# Patient Record
Sex: Male | Born: 1965 | Hispanic: No | Marital: Married | Smoking: Never smoker
Health system: Southern US, Community
[De-identification: ages and names within clinical notes are randomized; demographics above are authoritative.]

## PROBLEM LIST (undated history)

## (undated) DIAGNOSIS — J338 Other polyp of sinus: Secondary | ICD-10-CM

## (undated) DIAGNOSIS — Z8042 Family history of malignant neoplasm of prostate: Secondary | ICD-10-CM

## (undated) DIAGNOSIS — Z803 Family history of malignant neoplasm of breast: Secondary | ICD-10-CM

## (undated) DIAGNOSIS — C61 Malignant neoplasm of prostate: Secondary | ICD-10-CM

## (undated) DIAGNOSIS — R7989 Other specified abnormal findings of blood chemistry: Secondary | ICD-10-CM

## (undated) DIAGNOSIS — M109 Gout, unspecified: Secondary | ICD-10-CM

## (undated) DIAGNOSIS — I10 Essential (primary) hypertension: Secondary | ICD-10-CM

## (undated) HISTORY — DX: Other specified abnormal findings of blood chemistry: R79.89

## (undated) HISTORY — DX: Gout, unspecified: M10.9

## (undated) HISTORY — DX: Malignant neoplasm of prostate: C61

## (undated) HISTORY — DX: Family history of malignant neoplasm of breast: Z80.3

## (undated) HISTORY — DX: Other polyp of sinus: J33.8

## (undated) HISTORY — DX: Essential (primary) hypertension: I10

## (undated) HISTORY — PX: NASAL ENDOSCOPY: SHX286

## (undated) HISTORY — DX: Family history of malignant neoplasm of prostate: Z80.42

---
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
## 2007-02-08 DIAGNOSIS — I10 Essential (primary) hypertension: Secondary | ICD-10-CM

## 2007-02-08 HISTORY — DX: Essential (primary) hypertension: I10

## 2009-03-17 ENCOUNTER — Ambulatory Visit: Payer: Self-pay | Admitting: Gastroenterology

## 2012-07-09 ENCOUNTER — Ambulatory Visit (INDEPENDENT_AMBULATORY_CARE_PROVIDER_SITE_OTHER): Payer: BC Managed Care – PPO | Admitting: Internal Medicine

## 2012-07-09 ENCOUNTER — Encounter: Payer: Self-pay | Admitting: Internal Medicine

## 2012-07-09 VITALS — BP 132/84 | HR 102 | Temp 98.2°F | Ht 71.0 in | Wt 218.0 lb

## 2012-07-09 DIAGNOSIS — Z23 Encounter for immunization: Secondary | ICD-10-CM

## 2012-07-09 DIAGNOSIS — I10 Essential (primary) hypertension: Secondary | ICD-10-CM | POA: Insufficient documentation

## 2012-07-09 DIAGNOSIS — Z0000 Encounter for general adult medical examination without abnormal findings: Secondary | ICD-10-CM

## 2012-07-09 NOTE — Assessment & Plan Note (Addendum)
Found to have elevated BP 2 weeks ago, but told ~ 2-3 years his BP was elevated; was prescribed a medication recently  which he has been taking, BP today is normal. Discussed risk-consequences of elevated BP including MI stroke CHF CRI Plan: Will find out the name of the medicine. ADDENDUM: HCTZ 25 mg 1 po qd Labs Low-salt diet Followup in 3 months

## 2012-07-09 NOTE — Progress Notes (Signed)
  Subjective:    Patient ID: Justin Elliott, male    DOB: 06-09-1965, 47 y.o.   MRN: [id number]  HPI New patient, here to establish, request a checkup (CPX)  Went to urgent care 2 weeks ago because he felt slightly weak and dizzy, he was found to have elevated BP and  a medication was prescribed. Reports is taking the medication regularly, he is asymptomatic now, no labs or EKGs were done.  Past Medical History  Diagnosis Date  . HTN (hypertension) 2009   Past Surgical History  Procedure Laterality Date  . Finger surgery Right 1984   History   Social History  . Marital Status: Married    Spouse Name: N/A    Number of Children: 2  . Years of Education: N/A   Occupational History  . [employer]    Social History Main Topics  . Smoking status: Never Smoker   . Smokeless tobacco: Never Used  . Alcohol Use: No  . Drug Use: No  . Sexually Active: Not on file   Other Topics Concern  . Not on file   Social History Narrative  . No narrative on file   Family History  Problem Relation Age of Onset  . Diabetes type I Son   . Prostate cancer Father 45  . Colon cancer Neg Hx   . CAD Neg Hx   . Stroke Neg Hx   . Breast cancer Sister     Review of Systems Diet: Regular Exercise: None lately No chest pain or shortness of breath No nausea, vomiting, diarrhea or blood in the stools. No dysuria or gross hematuria.     Objective:   Physical Exam BP 132/84  Pulse 102  Temp(Src) 98.2 F (36.8 C) (Oral)  Ht 5\' 11"  (1.803 m)  Wt 218 lb (98.884 kg)  BMI 30.42 kg/m2  SpO2 95%  General -- alert, well-developed,NAD .   Neck --no thyromegaly , normal carotid pulse Lungs -- normal respiratory effort, no intercostal retractions, no accessory muscle use, and normal breath sounds.   Heart-- normal rate, regular rhythm, no murmur, and no gallop.   Abdomen--soft, non-tender, no distention, no masses, no bruit   Extremities-- no pretibial edema  bilaterally Rectal-- No external abnormalities noted. Normal sphincter tone. No rectal masses or tenderness. Brown stool, Hemoccult negative Prostate:  Prostate gland firm and smooth, no enlargement, nodularity, tenderness, mass, asymmetry or induration. Neurologic-- alert & oriented X3 and strength normal in all extremities. Psych-- Cognition and judgment appear intact. Alert and cooperative with normal attention span and concentration.  not anxious appearing and not depressed appearing.       Assessment & Plan:

## 2012-07-09 NOTE — Patient Instructions (Addendum)
Please call with the name of the medicine you are taking --- Please come back fasting: CMP, CBC, TSH, FLP, PSA--- dx v70 --- Check the  blood pressure 2 or 3 times a week, be sure it is between 110/60 and 140/85. If it is consistently higher or lower, let me know --- Next visit in 3 months      Sodium-Controlled Diet Sodium is a mineral. It is found in many foods. Sodium may be found naturally or added during the making of a food. The most common form of sodium is salt, which is made up of sodium and chloride. Reducing your sodium intake involves changing your eating habits. The following guidelines will help you reduce the sodium in your diet:  Stop using the salt shaker.  Use salt sparingly in cooking and baking.  Substitute with sodium-free seasonings and spices.  Do not use a salt substitute (potassium chloride) without your caregiver's permission.  Include a variety of fresh, unprocessed foods in your diet.  Limit the use of processed and convenience foods that are high in sodium. USE THE FOLLOWING FOODS SPARINGLY: Breads/Starches  Commercial bread stuffing, commercial pancake or waffle mixes, coating mixes. Waffles. Croutons. Prepared (boxed or frozen) potato, rice, or noodle mixes that contain salt or sodium. Salted [place] fries or hash browns. Salted popcorn, breads, crackers, chips, or snack foods. Vegetables  Vegetables canned with salt or prepared in cream, butter, or cheese sauces. Sauerkraut. Tomato or vegetable juices canned with salt.  Fresh vegetables are allowed if rinsed thoroughly. Fruit  Fruit is okay to eat. Meat and Meat Substitutes  Salted or smoked meats, such as bacon or Canadian bacon, chipped or corned beef, hot dogs, salt pork, luncheon meats, pastrami, ham, or sausage. Canned or smoked fish, poultry, or meat. Processed cheese or cheese spreads, blue or Roquefort cheese. Battered or frozen fish products. Prepared spaghetti sauce. Baked beans.  Reuben sandwiches. Salted nuts. Caviar. Milk  Limit buttermilk to 1 cup per week. Soups and Combination Foods  Bouillon cubes, canned or dried soups, broth, consomm. Convenience (frozen or packaged) dinners with more than 600 mg sodium. Pot pies, pizza, Asian food, fast food cheeseburgers, and specialty sandwiches. Desserts and Sweets  Regular (salted) desserts, pie, commercial fruit snack pies, commercial snack cakes, canned puddings.  Eat desserts and sweets in moderation. Fats and Oils  Gravy mixes or canned gravy. No more than 1 to 2 tbs of salad dressing. Chip dips.  Eat fats and oils in moderation. Beverages  See those listed under the vegetables and milk groups. Condiments  Ketchup, mustard, meat sauces, salsa, regular (salted) and lite soy sauce or mustard. Dill pickles, olives, meat tenderizer. Prepared horseradish or pickle relish. Dutch-processed cocoa. Baking powder or baking soda used medicinally. Worcestershire sauce. "Light" salt. Salt substitute, unless approved by your caregiver. Document Released: 07/16/2001 Document Revised: 04/18/2011 Document Reviewed: 02/16/2009 [facility] Patient Information 2014 [place], [place].

## 2012-07-09 NOTE — Assessment & Plan Note (Signed)
Last Tdap years ago, provided one today Never had a cscope Diet-- discussed Exercise-- rec 3 hours week + FH Prostate ca labs including a PSA

## 2012-07-10 ENCOUNTER — Telehealth: Payer: Self-pay | Admitting: Internal Medicine

## 2012-07-10 NOTE — Telephone Encounter (Signed)
Med has been added to med list.

## 2012-07-10 NOTE — Telephone Encounter (Signed)
thx

## 2012-07-10 NOTE — Telephone Encounter (Signed)
Dr. Drue Novel asked pt to bring in missing RX  It is :   HYDROCHLOROTHIAZIDE 25mg   Take one tablet by mouth in the morning    90Qty

## 2012-07-11 ENCOUNTER — Other Ambulatory Visit (INDEPENDENT_AMBULATORY_CARE_PROVIDER_SITE_OTHER): Payer: BC Managed Care – PPO

## 2012-07-11 DIAGNOSIS — Z0000 Encounter for general adult medical examination without abnormal findings: Secondary | ICD-10-CM

## 2012-07-11 LAB — CBC WITH DIFFERENTIAL/PLATELET
Basophils Relative: 0.3 % (ref 0.0–3.0)
Hemoglobin: 15.1 g/dL (ref 13.0–17.0)
Lymphocytes Relative: 35.2 % (ref 12.0–46.0)
Monocytes Relative: 10.7 % (ref 3.0–12.0)
Neutro Abs: 2.4 10*3/uL (ref 1.4–7.7)
RBC: 5.32 Mil/uL (ref 4.22–5.81)
WBC: 4.6 10*3/uL (ref 4.5–10.5)

## 2012-07-11 LAB — PSA: PSA: 0.95 ng/mL (ref 0.10–4.00)

## 2012-07-11 LAB — LIPID PANEL
Cholesterol: 185 mg/dL (ref 0–200)
Total CHOL/HDL Ratio: 7

## 2012-07-11 LAB — COMPREHENSIVE METABOLIC PANEL
AST: 22 U/L (ref 0–37)
BUN: 18 mg/dL (ref 6–23)
CO2: 30 mEq/L (ref 19–32)
Calcium: 10.1 mg/dL (ref 8.4–10.5)
Chloride: 102 mEq/L (ref 96–112)
Creatinine, Ser: 1.4 mg/dL (ref 0.4–1.5)
GFR: 56.21 mL/min — ABNORMAL LOW (ref >60.00)

## 2012-07-11 NOTE — Progress Notes (Signed)
Labs only

## 2012-07-16 ENCOUNTER — Encounter: Payer: Self-pay | Admitting: *Deleted

## 2013-06-26 ENCOUNTER — Telehealth: Payer: Self-pay | Admitting: Internal Medicine

## 2013-06-26 ENCOUNTER — Ambulatory Visit (INDEPENDENT_AMBULATORY_CARE_PROVIDER_SITE_OTHER): Payer: BC Managed Care – PPO | Admitting: Internal Medicine

## 2013-06-26 ENCOUNTER — Encounter: Payer: Self-pay | Admitting: Internal Medicine

## 2013-06-26 VITALS — BP 115/73 | HR 97 | Temp 98.3°F | Wt 224.0 lb

## 2013-06-26 DIAGNOSIS — I10 Essential (primary) hypertension: Secondary | ICD-10-CM

## 2013-06-26 MED ORDER — HYDROCHLOROTHIAZIDE 25 MG PO TABS: 25.0000 mg | ORAL_TABLET | Freq: Every day | ORAL | Status: DC

## 2013-06-26 NOTE — Telephone Encounter (Signed)
Relevant patient education mailed to patient.  

## 2013-06-26 NOTE — Assessment & Plan Note (Signed)
On HCTZ, BP today is very good. He is doing better with diet and exercise Plan: Refill medications, check a BMP, ambulatory BPs, return to the office 4 months for physical

## 2013-06-26 NOTE — Progress Notes (Signed)
   Subjective:    Patient ID: Justin Elliott, male    DOB: 19-Jan-1966, 48 y.o.   MRN: [id number]  DOS:  06/26/2013 Type of  visit: Visit High blood pressure, good compliance with HCTZ. Doing better with diet and exercise. Ambulatory BPs? Patient not sure, thinks are okay.   ROS Denies chest pain, difficulty breathing or lower extremity edema No nausea, vomiting, diarrhea  Past Medical History  Diagnosis Date  . HTN (hypertension) 2009    Past Surgical History  Procedure Laterality Date  . Finger surgery Right 1984    History   Social History  . Marital Status: Married    Spouse Name: N/A    Number of Children: 2  . Years of Education: N/A   Occupational History  . [employer]    Social History Main Topics  . Smoking status: Never Smoker   . Smokeless tobacco: Never Used  . Alcohol Use: No  . Drug Use: No  . Sexual Activity: Not on file   Other Topics Concern  . Not on file   Social History Narrative  . No narrative on file        Medication List       This list is accurate as of: 06/26/13  7:44 PM.  Always use your most recent med list.               hydrochlorothiazide 25 MG tablet  Commonly known as:  HYDRODIURIL  Take 1 tablet (25 mg total) by mouth daily.           Objective:   Physical Exam BP 115/73  Pulse 97  Temp(Src) 98.3 F (36.8 C) (Oral)  Wt 224 lb (101.606 kg)  SpO2 100% General -- alert, well-developed, NAD.   Lungs -- normal respiratory effort, no intercostal retractions, no accessory muscle use, and normal breath sounds.  Heart-- normal rate, regular rhythm, no murmur.  Extremities-- no pretibial edema bilaterally  Neurologic--  alert & oriented X3. Speech normal, gait normal, strength normal in all extremities.  Psych-- Cognition and judgment appear intact. Cooperative with normal attention span and concentration. No anxious or depressed appearing.        Assessment & Plan:

## 2013-06-26 NOTE — Progress Notes (Signed)
Pre-visit discussion using our clinic review tool. No additional management support is needed unless otherwise documented below in the visit note.  

## 2013-06-26 NOTE — Patient Instructions (Signed)
Get your blood work before you leave   Check the  blood pressure 2 or 3 times a  week be sure it is between 110/60 and 140/85. Ideal blood pressure is 120/80. If it is consistently higher or lower, let me know  Next visit is for a physical exam in 4 months  , fasting Please make an appointment

## 2013-06-27 LAB — BASIC METABOLIC PANEL
BUN: 20 mg/dL (ref 6–23)
CALCIUM: 10.2 mg/dL (ref 8.4–10.5)
CHLORIDE: 102 meq/L (ref 96–112)
CO2: 26 meq/L (ref 19–32)
CREATININE: 1.6 mg/dL — AB (ref 0.4–1.5)
GFR: 50.63 mL/min — ABNORMAL LOW (ref >60.00)
GLUCOSE: 82 mg/dL (ref 70–99)
Potassium: 3.7 mEq/L (ref 3.5–5.1)
Sodium: 139 mEq/L (ref 135–145)

## 2013-07-02 ENCOUNTER — Encounter: Payer: Self-pay | Admitting: *Deleted

## 2013-07-12 ENCOUNTER — Telehealth: Payer: Self-pay

## 2013-07-12 NOTE — Telephone Encounter (Signed)
Patient called to review the kidney function test. Advised per recommendations. Patient verbalizes understanding.

## 2013-10-11 ENCOUNTER — Encounter: Payer: BC Managed Care – PPO | Admitting: Internal Medicine

## 2013-10-11 DIAGNOSIS — Z0289 Encounter for other administrative examinations: Secondary | ICD-10-CM

## 2014-03-07 ENCOUNTER — Encounter: Payer: Self-pay | Admitting: Internal Medicine

## 2014-03-07 ENCOUNTER — Ambulatory Visit (INDEPENDENT_AMBULATORY_CARE_PROVIDER_SITE_OTHER): Payer: BLUE CROSS/BLUE SHIELD | Admitting: Internal Medicine

## 2014-03-07 VITALS — BP 118/82 | HR 81 | Temp 98.2°F | Ht 71.0 in | Wt 215.4 lb

## 2014-03-07 DIAGNOSIS — Z23 Encounter for immunization: Secondary | ICD-10-CM

## 2014-03-07 DIAGNOSIS — R972 Elevated prostate specific antigen [PSA]: Secondary | ICD-10-CM

## 2014-03-07 DIAGNOSIS — Z0000 Encounter for general adult medical examination without abnormal findings: Secondary | ICD-10-CM

## 2014-03-07 DIAGNOSIS — I10 Essential (primary) hypertension: Secondary | ICD-10-CM

## 2014-03-07 LAB — COMPREHENSIVE METABOLIC PANEL
ALBUMIN: 4.4 g/dL (ref 3.5–5.2)
ALK PHOS: 55 U/L (ref 39–117)
ALT: 21 U/L (ref 0–53)
AST: 18 U/L (ref 0–37)
BILIRUBIN TOTAL: 0.6 mg/dL (ref 0.2–1.2)
BUN: 15 mg/dL (ref 6–23)
CALCIUM: 10 mg/dL (ref 8.4–10.5)
CO2: 25 mEq/L (ref 19–32)
CREATININE: 1.48 mg/dL — AB (ref 0.50–1.35)
Chloride: 104 mEq/L (ref 96–112)
GLUCOSE: 89 mg/dL (ref 70–99)
POTASSIUM: 4 meq/L (ref 3.5–5.3)
Sodium: 140 mEq/L (ref 135–145)
Total Protein: 7.6 g/dL (ref 6.0–8.3)

## 2014-03-07 LAB — CBC WITH DIFFERENTIAL/PLATELET
BASOS ABS: 0 10*3/uL (ref 0.0–0.1)
BASOS PCT: 0 % (ref 0–1)
Eosinophils Absolute: 0 10*3/uL (ref 0.0–0.7)
Eosinophils Relative: 1 % (ref 0–5)
HCT: 44 % (ref 39.0–52.0)
Hemoglobin: 14.9 g/dL (ref 13.0–17.0)
Lymphocytes Relative: 40 % (ref 12–46)
Lymphs Abs: 1.8 10*3/uL (ref 0.7–4.0)
MCH: 27.3 pg (ref 26.0–34.0)
MCHC: 33.9 g/dL (ref 30.0–36.0)
MCV: 80.6 fL (ref 78.0–100.0)
MPV: 11.3 fL (ref 8.6–12.4)
Monocytes Absolute: 0.2 10*3/uL (ref 0.1–1.0)
Monocytes Relative: 5 % (ref 3–12)
NEUTROS PCT: 54 % (ref 43–77)
Neutro Abs: 2.5 10*3/uL (ref 1.7–7.7)
PLATELETS: 246 10*3/uL (ref 150–400)
RBC: 5.46 MIL/uL (ref 4.22–5.81)
RDW: 13.5 % (ref 11.5–15.5)
WBC: 4.6 10*3/uL (ref 4.0–10.5)

## 2014-03-07 LAB — LIPID PANEL
Cholesterol: 205 mg/dL — ABNORMAL HIGH (ref 0–200)
HDL: 29 mg/dL — AB (ref >39)
LDL CALC: 139 mg/dL — AB (ref 0–99)
TRIGLYCERIDES: 184 mg/dL — AB (ref <150)
Total CHOL/HDL Ratio: 7.1 Ratio
VLDL: 37 mg/dL (ref 0–40)

## 2014-03-07 NOTE — Assessment & Plan Note (Addendum)
Td 2014 Flu shot today Never had a cscope Diet--Exercise-- was doing great last year,  eating healthier and exercising more. Recommend to go back to the dose healthy habits. + FH Prostate ca labs including a PSA

## 2014-03-07 NOTE — Progress Notes (Signed)
   Subjective:    Patient ID: Justin Elliott, male    DOB: February 21, 1965, 49 y.o.   MRN: [id number]  DOS:  03/07/2014 Type of visit - description : cpx Interval history: In general feeling well, good compliance w/ medication. Concerned about his last creatinine  ROS  denies chest pain or difficulty breathing No nausea, vomiting, diarrhea or blood in the stools No recent cough or wheezing No anxiety depression No dysuria, gross hematuria or difficulty urinating  Past Medical History  Diagnosis Date  . HTN (hypertension) 2009    Past Surgical History  Procedure Laterality Date  . Finger surgery Right 1984    History   Social History  . Marital Status: Married    Spouse Name: N/A    Number of Children: 2  . Years of Education: N/A   Occupational History  . [employer]    Social History Main Topics  . Smoking status: Never Smoker   . Smokeless tobacco: Never Used  . Alcohol Use: No  . Drug Use: No  . Sexual Activity: Not on file   Other Topics Concern  . Not on file   Social History Narrative   Household-- wife, 2 children (high school, [employer])     Family History  Problem Relation Age of Onset  . Diabetes type I Son   . Prostate cancer Father 38  . Colon cancer Neg Hx   . CAD Neg Hx   . Stroke Neg Hx   . Breast cancer Sister        Medication List       This list is accurate as of: 03/07/14 11:59 PM.  Always use your most recent med list.               hydrochlorothiazide 25 MG tablet  Commonly known as:  HYDRODIURIL  Take 1 tablet (25 mg total) by mouth daily.           Objective:   Physical Exam BP 118/82 mmHg  Pulse 81  Temp(Src) 98.2 F (36.8 C) (Oral)  Ht 5\' 11"  (1.803 m)  Wt 215 lb 6 oz (97.693 kg)  BMI 30.05 kg/m2  SpO2 96%  General -- alert, well-developed, NAD.  Neck --no thyromegaly   HEENT-- Not pale.   Lungs -- normal respiratory effort, no intercostal retractions, no accessory muscle use, and  normal breath sounds.  Heart-- normal rate, regular rhythm, no murmur.  Abdomen-- Not distended, good bowel sounds,soft, non-tender. No bruit  Rectal-- No external abnormalities noted. Normal sphincter tone. No rectal masses or tenderness. No stool   Prostate--Prostate gland firm and smooth, no enlargement, nodularity, tenderness, mass, asymmetry or induration. Extremities-- no pretibial edema bilaterally  Neurologic--  alert & oriented X3. Speech normal, gait appropriate for age, strength symmetric and appropriate for age.  Psych-- Cognition and judgment appear intact. Cooperative with normal attention span and concentration. No anxious or depressed appearing.         Assessment & Plan:

## 2014-03-07 NOTE — Progress Notes (Signed)
Pre visit review using our clinic review tool, if applicable. No additional management support is needed unless otherwise documented below in the visit note. 

## 2014-03-07 NOTE — Patient Instructions (Signed)
Get your blood work before you leave   Check the  blood pressure monthly  Be sure your blood pressure is between 110/65 and  145/85.  if it is consistently higher or lower, let me know     Please come back to the office in 1 year  for a physical exam. Come back fasting

## 2014-03-07 NOTE — Assessment & Plan Note (Addendum)
BP well-controlled with HCTZ, last creatinine was 1.6, previous creatinine 1.4 about 2 years ago. No older BMPs available. Doubt he has CRI, this could be related  to his large muscle mass. Nevertheless, I recommend to drink plenty of fluids, avoid Motrin or similar medications and will recheck his BMP today. Consider discontinue HCTZ or do further testing such as renal ultrasound

## 2014-03-08 LAB — PSA: PSA: 2.69 ng/mL (ref <=4.00)

## 2014-03-08 LAB — TSH: TSH: 1.486 u[IU]/mL (ref 0.350–4.500)

## 2014-03-10 NOTE — Addendum Note (Signed)
Addended by: Janalee Dane C on: 03/10/2014 05:00 PM   Modules accepted: Orders

## 2014-07-05 ENCOUNTER — Other Ambulatory Visit: Payer: Self-pay | Admitting: Internal Medicine

## 2014-07-08 ENCOUNTER — Other Ambulatory Visit: Payer: Self-pay

## 2014-07-08 MED ORDER — HYDROCHLOROTHIAZIDE 25 MG PO TABS: 25.0000 mg | ORAL_TABLET | Freq: Every day | ORAL | Status: DC

## 2014-07-08 NOTE — Telephone Encounter (Signed)
Rx faxed to [facility].  

## 2014-07-08 NOTE — Telephone Encounter (Signed)
E-prescribing currently down, Rx printed, awaiting MD signature.

## 2014-09-08 ENCOUNTER — Telehealth: Payer: Self-pay | Admitting: Internal Medicine

## 2014-09-08 NOTE — Telephone Encounter (Signed)
Advise patient, needs his PSA recheck ( dx--- increased PSA velocity)

## 2014-09-09 NOTE — Telephone Encounter (Signed)
Letter printed and mailed to Pt informing him he is due for PSA recheck from 02/2014 office visit. Instructed him to call office at his convenience to schedule a lab appt.

## 2014-10-24 ENCOUNTER — Other Ambulatory Visit: Payer: Self-pay | Admitting: Internal Medicine

## 2015-03-09 ENCOUNTER — Telehealth: Payer: Self-pay | Admitting: General Practice

## 2015-03-09 NOTE — Telephone Encounter (Signed)
Called pt to complete pre-visit phone call. LMOVM to return call. Patient advised on message that if unable to return call to please arrive 10-15 minutes early for appointment to update all insurance and paperwork with front desk.

## 2015-03-10 ENCOUNTER — Encounter: Payer: BC Managed Care – PPO | Admitting: Internal Medicine

## 2015-06-15 ENCOUNTER — Telehealth: Payer: Self-pay | Admitting: Behavioral Health

## 2015-06-15 NOTE — Telephone Encounter (Signed)
Unable to reach patient at time of Pre-Visit Call.  Left message for patient to return call when available.    

## 2015-06-16 ENCOUNTER — Encounter: Payer: Self-pay | Admitting: Internal Medicine

## 2015-06-16 ENCOUNTER — Ambulatory Visit (INDEPENDENT_AMBULATORY_CARE_PROVIDER_SITE_OTHER): Payer: BC Managed Care – PPO | Admitting: Internal Medicine

## 2015-06-16 VITALS — BP 116/82 | HR 69 | Temp 98.2°F | Ht 71.0 in | Wt 222.0 lb

## 2015-06-16 DIAGNOSIS — Z1211 Encounter for screening for malignant neoplasm of colon: Secondary | ICD-10-CM

## 2015-06-16 DIAGNOSIS — R7989 Other specified abnormal findings of blood chemistry: Secondary | ICD-10-CM

## 2015-06-16 DIAGNOSIS — Z0000 Encounter for general adult medical examination without abnormal findings: Secondary | ICD-10-CM | POA: Diagnosis not present

## 2015-06-16 DIAGNOSIS — I10 Essential (primary) hypertension: Secondary | ICD-10-CM

## 2015-06-16 NOTE — Patient Instructions (Signed)
GO TO THE LAB : Get the blood work     GO TO THE FRONT DESK Schedule your next appointment for a routine checkup in 6 months, no fasting    Check the  blood pressure   weekly  Be sure your blood pressure is between 110/65 and  135/85. If it is consistently higher or lower, let me know  We'll schedule a ultrasound of your kidneys

## 2015-06-16 NOTE — Progress Notes (Signed)
Pre visit review using our clinic review tool, if applicable. No additional management support is needed unless otherwise documented below in the visit note. 

## 2015-06-16 NOTE — Progress Notes (Signed)
Subjective:    Patient ID: Justin Elliott, male    DOB: 10/28/1965, 50 y.o.   MRN: [id number]  DOS:  06/16/2015 Type of visit - description : CPX Interval history: Since the last time he was here he is feeling well. Unfortunately lost his father few months ago. Mother had a stroke last month, she is recovering well.   Review of Systems  Constitutional: No fever. No chills. No unexplained wt changes. No unusual sweats  HEENT: No dental problems, no ear discharge, no facial swelling, no voice changes. No eye discharge, no eye  redness , no  intolerance to light   Respiratory: No wheezing , no  difficulty breathing. No cough , no mucus production  Cardiovascular: No CP, no leg swelling , no  Palpitations  GI: no nausea, no vomiting, no diarrhea , no  abdominal pain.  No blood in the stools. No dysphagia, no odynophagia    Endocrine: No polyphagia, no polyuria , no polydipsia  GU: No dysuria, gross hematuria, difficulty urinating. No urinary urgency, no frequency.  Musculoskeletal: No joint swellings or unusual aches or pains  Skin: No change in the color of the skin, palor , no  Rash  Allergic, immunologic: No environmental allergies , no  food allergies  Neurological: No dizziness no  syncope. No headaches. No diplopia, no slurred, no slurred speech, no motor deficits, no facial  Numbness  Hematological: No enlarged lymph nodes, no easy bruising , no unusual bleedings  Psychiatry: No suicidal ideas, no hallucinations, no beavior problems, no confusion.     Past Medical History  Diagnosis Date  . HTN (hypertension) 2009    Past Surgical History  Procedure Laterality Date  . Finger surgery Right 1984    Social History   Social History  . Marital Status: Married    Spouse Name: N/A  . Number of Children: 2  . Years of Education: N/A   Occupational History  . [employer]    Social History Main Topics  . Smoking status: Never Smoker   .  Smokeless tobacco: Never Used  . Alcohol Use: No  . Drug Use: No  . Sexual Activity: Not on file   Other Topics Concern  . Not on file   Social History Narrative   Household-- wife, 2 children  D 2003, S 1998     Family History  Problem Relation Age of Onset  . Diabetes type I Son   . Prostate cancer Father 84    died 09-Feb-2015 age 43  . Breast cancer Father   . Colon cancer Neg Hx   . CAD Neg Hx   . Breast cancer Sister   . Stroke Mother       Medication List       This list is accurate as of: 06/16/15 11:59 PM.  Always use your most recent med list.               hydrochlorothiazide 25 MG tablet  Commonly known as:  HYDRODIURIL  Take 1 tablet (25 mg total) by mouth daily.           Objective:   Physical Exam BP 116/82 mmHg  Pulse 69  Temp(Src) 98.2 F (36.8 C) (Oral)  Ht 5\' 11"  (1.803 m)  Wt 222 lb (100.699 kg)  BMI 30.98 kg/m2  SpO2 98%  General:   Well developed, well nourished . NAD.  Neck: No  thyromegaly , normal carotid pulse HEENT:  Normocephalic . Face  symmetric, atraumatic Lungs:  CTA B Normal respiratory effort, no intercostal retractions, no accessory muscle use. Heart: RRR,  no murmur.  No pretibial edema bilaterally  Abdomen:  Not distended, soft, non-tender. No rebound or rigidity.   Rectal:  External abnormalities: none. Normal sphincter tone. No rectal masses or tenderness.  Stool brown  Prostate: Prostate gland firm and smooth, no enlargement, nodularity, tenderness, mass, asymmetry or induration.  Skin: Exposed ,areas without rash. Not pale. Not jaundice Neurologic:  alert & oriented X3.  Speech normal, gait appropriate for age and unassisted Strength symmetric and appropriate for age.  Psych: Cognition and judgment appear intact.  Cooperative with normal attention span and concentration.  Behavior appropriate. no depressed  appearing.    Assessment & Plan:  Assessment: HTN  Plan HTN: On HCTZ, BP today is very good,  at home is rather 130, 140, patient not sure if readings are accurate. Creatinine has been up to 1.6, he has a large muscle mass. To be sure will check a ultrasound and a microalbumin. Recommend ambulatory BPs. RTC 6 months

## 2015-06-16 NOTE — Assessment & Plan Note (Addendum)
Td 2014 + FH prostate and breast cancer, also TIA stroke. CCS:  cscope vs hemocult discussed, elected cscope, will refer   Prostate cancer screening: Last PSA show slight increased velocity, DRE today negative, check another PSA today. Diet--Exercise-- counseled EKG: Normal sinus rhythm Labs: CMP, FLP, CBC, micro, PSA

## 2015-06-17 LAB — COMPREHENSIVE METABOLIC PANEL
ALT: 15 U/L (ref 0–53)
AST: 14 U/L (ref 0–37)
Albumin: 4.4 g/dL (ref 3.5–5.2)
Alkaline Phosphatase: 51 U/L (ref 39–117)
BUN: 16 mg/dL (ref 6–23)
CHLORIDE: 107 meq/L (ref 96–112)
CO2: 28 meq/L (ref 19–32)
Calcium: 10.2 mg/dL (ref 8.4–10.5)
Creatinine, Ser: 1.55 mg/dL — ABNORMAL HIGH (ref 0.40–1.50)
GFR: 50.6 mL/min — ABNORMAL LOW (ref >60.00)
GLUCOSE: 97 mg/dL (ref 70–99)
Potassium: 4.3 mEq/L (ref 3.5–5.1)
SODIUM: 144 meq/L (ref 135–145)
Total Bilirubin: 0.5 mg/dL (ref 0.2–1.2)
Total Protein: 7.3 g/dL (ref 6.0–8.3)

## 2015-06-17 LAB — CBC WITH DIFFERENTIAL/PLATELET
BASOS PCT: 0.5 % (ref 0.0–3.0)
Basophils Absolute: 0 10*3/uL (ref 0.0–0.1)
EOS PCT: 1.3 % (ref 0.0–5.0)
Eosinophils Absolute: 0.1 10*3/uL (ref 0.0–0.7)
HCT: 42 % (ref 39.0–52.0)
Hemoglobin: 13.8 g/dL (ref 13.0–17.0)
Lymphocytes Relative: 39.1 % (ref 12.0–46.0)
Lymphs Abs: 2 10*3/uL (ref 0.7–4.0)
MCHC: 32.8 g/dL (ref 30.0–36.0)
MCV: 84.5 fl (ref 78.0–100.0)
MONO ABS: 0.3 10*3/uL (ref 0.1–1.0)
Monocytes Relative: 6.3 % (ref 3.0–12.0)
Neutro Abs: 2.7 10*3/uL (ref 1.4–7.7)
Neutrophils Relative %: 52.8 % (ref 43.0–77.0)
Platelets: 221 10*3/uL (ref 150.0–400.0)
RBC: 4.96 Mil/uL (ref 4.22–5.81)
RDW: 13 % (ref 11.5–15.5)
WBC: 5.2 10*3/uL (ref 4.0–10.5)

## 2015-06-17 LAB — MICROALBUMIN / CREATININE URINE RATIO
Creatinine,U: 166.4 mg/dL
Microalb Creat Ratio: 0.4 mg/g (ref 0.0–30.0)

## 2015-06-17 LAB — LIPID PANEL
CHOL/HDL RATIO: 7
CHOLESTEROL: 196 mg/dL (ref 0–200)
HDL: 30.1 mg/dL — ABNORMAL LOW (ref >39.00)
LDL CALC: 127 mg/dL — AB (ref 0–99)
NonHDL: 165.9
Triglycerides: 197 mg/dL — ABNORMAL HIGH (ref 0.0–149.0)
VLDL: 39.4 mg/dL (ref 0.0–40.0)

## 2015-06-17 LAB — PSA: PSA: 1.74 ng/mL (ref 0.10–4.00)

## 2015-06-18 ENCOUNTER — Encounter: Payer: Self-pay | Admitting: Internal Medicine

## 2015-06-28 DIAGNOSIS — Z09 Encounter for follow-up examination after completed treatment for conditions other than malignant neoplasm: Secondary | ICD-10-CM | POA: Insufficient documentation

## 2015-06-28 NOTE — Assessment & Plan Note (Signed)
HTN: On HCTZ, BP today is very good, at home is rather 130, 140, patient not sure if readings are accurate. Creatinine has been up to 1.6, he has a large muscle mass. To be sure will check a ultrasound and a microalbumin. Recommend ambulatory BPs. RTC 6 months

## 2015-07-13 ENCOUNTER — Other Ambulatory Visit: Payer: Self-pay | Admitting: Internal Medicine

## 2015-07-30 ENCOUNTER — Ambulatory Visit (AMBULATORY_SURGERY_CENTER): Payer: Self-pay

## 2015-07-30 VITALS — Ht 66.0 in | Wt 215.0 lb

## 2015-07-30 DIAGNOSIS — Z1211 Encounter for screening for malignant neoplasm of colon: Secondary | ICD-10-CM

## 2015-07-30 MED ORDER — NA SULFATE-K SULFATE-MG SULF 17.5-3.13-1.6 GM/177ML PO SOLN: 1.0000 | Freq: Once | ORAL | Status: DC

## 2015-07-30 NOTE — Progress Notes (Signed)
No egg or soy allergy.  No previous complications from anesthesia. No home O2. No diet meds. 

## 2015-08-03 ENCOUNTER — Encounter: Payer: Self-pay | Admitting: Internal Medicine

## 2015-08-07 ENCOUNTER — Ambulatory Visit (HOSPITAL_BASED_OUTPATIENT_CLINIC_OR_DEPARTMENT_OTHER)
Admission: RE | Admit: 2015-08-07 | Discharge: 2015-08-07 | Disposition: A | Discharge status: Home / Self Care | Payer: BC Managed Care – PPO | Source: Ambulatory Visit | Attending: Internal Medicine | Admitting: Internal Medicine

## 2015-08-07 DIAGNOSIS — R748 Abnormal levels of other serum enzymes: Secondary | ICD-10-CM | POA: Diagnosis present

## 2015-08-07 DIAGNOSIS — I10 Essential (primary) hypertension: Secondary | ICD-10-CM | POA: Insufficient documentation

## 2015-08-13 ENCOUNTER — Ambulatory Visit (AMBULATORY_SURGERY_CENTER): Payer: BC Managed Care – PPO | Admitting: Internal Medicine

## 2015-08-13 ENCOUNTER — Encounter: Payer: Self-pay | Admitting: Internal Medicine

## 2015-08-13 VITALS — BP 134/87 | HR 64 | Temp 97.5°F | Resp 21 | Ht 66.0 in | Wt 215.0 lb

## 2015-08-13 DIAGNOSIS — K635 Polyp of colon: Secondary | ICD-10-CM | POA: Diagnosis not present

## 2015-08-13 DIAGNOSIS — D124 Benign neoplasm of descending colon: Secondary | ICD-10-CM

## 2015-08-13 DIAGNOSIS — Z1211 Encounter for screening for malignant neoplasm of colon: Secondary | ICD-10-CM | POA: Diagnosis present

## 2015-08-13 DIAGNOSIS — D125 Benign neoplasm of sigmoid colon: Secondary | ICD-10-CM

## 2015-08-13 MED ORDER — SODIUM CHLORIDE 0.9 % IV SOLN: 500.0000 mL | INTRAVENOUS | Status: DC

## 2015-08-13 NOTE — Patient Instructions (Signed)
YOU HAD AN ENDOSCOPIC PROCEDURE TODAY AT THE [facility]:   Refer to the procedure report that was given to you for any specific questions about what was found during the examination.  If the procedure report does not answer your questions, please call your gastroenterologist to clarify.  If you requested that your care partner not be given the details of your procedure findings, then the procedure report has been included in a sealed envelope for you to review at your convenience later.  YOU SHOULD EXPECT: Some feelings of bloating in the abdomen. Passage of more gas than usual.  Walking can help get rid of the air that was put into your GI tract during the procedure and reduce the bloating. If you had a lower endoscopy (such as a colonoscopy or flexible sigmoidoscopy) you may notice spotting of blood in your stool or on the toilet paper. If you underwent a bowel prep for your procedure, you may not have a normal bowel movement for a few days.  Please Note:  You might notice some irritation and congestion in your nose or some drainage.  This is from the oxygen used during your procedure.  There is no need for concern and it should clear up in a day or so.  SYMPTOMS TO REPORT IMMEDIATELY:   Following lower endoscopy (colonoscopy or flexible sigmoidoscopy):  Excessive amounts of blood in the stool  Significant tenderness or worsening of abdominal pains  Swelling of the abdomen that is new, acute  Fever of 100F or higher   For urgent or emergent issues, a gastroenterologist can be reached at any hour by calling [phone].   DIET: Your first meal following the procedure should be a small meal and then it is ok to progress to your normal diet. Heavy or fried foods are harder to digest and may make you feel nauseous or bloated.  Likewise, meals heavy in dairy and vegetables can increase bloating.  Drink plenty of fluids but you should avoid alcoholic beverages for 24  hours.  ACTIVITY:  You should plan to take it easy for the rest of today and you should NOT DRIVE or use heavy machinery until tomorrow (because of the sedation medicines used during the test).    FOLLOW UP: Our staff will call the number listed on your records the next business day following your procedure to check on you and address any questions or concerns that you may have regarding the information given to you following your procedure. If we do not reach you, we will leave a message.  However, if you are feeling well and you are not experiencing any problems, there is no need to return our call.  We will assume that you have returned to your regular daily activities without incident.  If any biopsies were taken you will be contacted by phone or by letter within the next 1-3 weeks.  Please call us at [phone] if you have not heard about the biopsies in 3 weeks.    SIGNATURES/CONFIDENTIALITY: You and/or your care partner have signed paperwork which will be entered into your electronic medical record.  These signatures attest to the fact that that the information above on your After Visit Summary has been reviewed and is understood.  Full responsibility of the confidentiality of this discharge information lies with you and/or your care-partner.  Polyp information given.  

## 2015-08-13 NOTE — Progress Notes (Signed)
To recovery, report to Scott, RN, VSS 

## 2015-08-13 NOTE — Op Note (Signed)
[place] Patient Name: Justin Elliott Procedure Date: 08/13/2015 10:20 AM MRN: [id number] Endoscopist: Jerene Bears , MD Age: 50 Referring MD:  Date of Birth: 1965-11-18 Gender: Male Account #: [id number] Procedure:                Colonoscopy Indications:              Screening for colorectal malignant neoplasm, This                            is the patient's first colonoscopy Medicines:                Monitored Anesthesia Care Procedure:                Pre-Anesthesia Assessment:                           - Prior to the procedure, a History and Physical                            was performed, and patient medications and                            allergies were reviewed. The patient's tolerance of                            previous anesthesia was also reviewed. The risks                            and benefits of the procedure and the sedation                            options and risks were discussed with the patient.                            All questions were answered, and informed consent                            was obtained. Prior Anticoagulants: The patient has                            taken no previous anticoagulant or antiplatelet                            agents. ASA Grade Assessment: II - A patient with                            mild systemic disease. After reviewing the risks                            and benefits, the patient was deemed in                            satisfactory condition to undergo the procedure.  After obtaining informed consent, the colonoscope                            was passed under direct vision. Throughout the                            procedure, the patient's blood pressure, pulse, and                            oxygen saturations were monitored continuously. The                            Model CF-HQ190L [id number]) scope was introduced                            through the anus and  advanced to the the cecum,                            identified by appendiceal orifice and ileocecal                            valve. The colonoscopy was performed without                            difficulty. The patient tolerated the procedure                            well. The quality of the bowel preparation was                            excellent. The ileocecal valve, appendiceal                            orifice, and rectum were photographed. Scope In: 10:29:00 AM Scope Out: 10:40:37 AM Scope Withdrawal Time: 0 hours 9 minutes 45 seconds  Total Procedure Duration: 0 hours 11 minutes 37 seconds  Findings:                 The digital rectal exam was normal.                           Two sessile polyps were found in the sigmoid colon                            and descending colon. The polyps were 4 to 5 mm in                            size. These polyps were removed with a cold snare.                            Resection and retrieval were complete.                           The exam was otherwise without abnormality on  direct and retroflexion views. Complications:            No immediate complications. Estimated Blood Loss:     Estimated blood loss was minimal. Impression:               - Two 4 to 5 mm polyps in the sigmoid colon and in                            the descending colon, removed with a cold snare.                            Resected and retrieved.                           - The examination was otherwise normal on direct                            and retroflexion views. Recommendation:           - Patient has a contact number available for                            emergencies. The signs and symptoms of potential                            delayed complications were discussed with the                            patient. Return to normal activities tomorrow.                            Written discharge instructions were provided to  the                            patient.                           - Resume previous diet.                           - Continue present medications.                           - Await pathology results.                           - Repeat colonoscopy is recommended. The                            colonoscopy date will be determined after pathology                            results from today's exam become available for                            review. Jerene Bears, MD 08/13/2015 10:43:08 AM This report has been signed  electronically.

## 2015-08-13 NOTE — Progress Notes (Signed)
Called to room to assist during endoscopic procedure.  Patient ID and intended procedure confirmed with present staff. Received instructions for my participation in the procedure from the performing physician.  

## 2015-08-14 ENCOUNTER — Telehealth: Payer: Self-pay | Admitting: *Deleted

## 2015-08-14 NOTE — Telephone Encounter (Signed)
Number identifier, left message, follow-up  

## 2015-08-24 ENCOUNTER — Encounter: Payer: Self-pay | Admitting: Internal Medicine

## 2015-12-01 ENCOUNTER — Telehealth: Payer: Self-pay | Admitting: Internal Medicine

## 2015-12-01 NOTE — Telephone Encounter (Signed)
Pt dropped off health examination certificate for dr. Larose Kells to fill out, pt states he will pick up and if possible on 12/04/15, documents left in tray at front office

## 2015-12-02 NOTE — Telephone Encounter (Signed)
lvm for pt, informing him of the  Message below, asked pt to please return call.

## 2015-12-02 NOTE — Telephone Encounter (Signed)
Received physical form, and reviewed, Pt had CPE in 06/2015, however he will need Hep B titers or vaccinations, and a TB test. Please call Pt and schedule appt at his convenience. PCP will be off remainder of week due to medical board exams. Earliest appt next week unless he agrees to see another provider. Thank you.

## 2015-12-04 NOTE — Telephone Encounter (Signed)
Patient scheduled with PCP Friday 12/11/15.

## 2015-12-11 ENCOUNTER — Ambulatory Visit (INDEPENDENT_AMBULATORY_CARE_PROVIDER_SITE_OTHER): Payer: BC Managed Care – PPO

## 2015-12-11 ENCOUNTER — Ambulatory Visit (INDEPENDENT_AMBULATORY_CARE_PROVIDER_SITE_OTHER): Payer: BC Managed Care – PPO | Admitting: Internal Medicine

## 2015-12-11 DIAGNOSIS — I10 Essential (primary) hypertension: Secondary | ICD-10-CM

## 2015-12-11 DIAGNOSIS — Z111 Encounter for screening for respiratory tuberculosis: Secondary | ICD-10-CM | POA: Diagnosis not present

## 2015-12-11 NOTE — Progress Notes (Deleted)
Pre visit review using our clinic review tool, if applicable. No additional management support is needed unless otherwise documented below in the visit note. 

## 2015-12-11 NOTE — Progress Notes (Signed)
Pre visit review using our clinic tool,if applicable. No additional management support is needed unless otherwise documented below in the visit note.   Patient in for PPD. Will return on Monday for recheck.

## 2015-12-14 LAB — TB SKIN TEST
Induration: 0 mm
TB Skin Test: NEGATIVE

## 2015-12-14 NOTE — Progress Notes (Signed)
appointment cancelled

## 2015-12-21 ENCOUNTER — Other Ambulatory Visit: Payer: Self-pay

## 2015-12-25 ENCOUNTER — Encounter: Payer: Self-pay | Admitting: Internal Medicine

## 2015-12-25 ENCOUNTER — Ambulatory Visit (INDEPENDENT_AMBULATORY_CARE_PROVIDER_SITE_OTHER): Payer: BC Managed Care – PPO | Admitting: Internal Medicine

## 2015-12-25 VITALS — BP 122/78 | HR 79 | Temp 97.9°F | Resp 12 | Ht 66.0 in | Wt 229.0 lb

## 2015-12-25 DIAGNOSIS — I10 Essential (primary) hypertension: Secondary | ICD-10-CM

## 2015-12-25 DIAGNOSIS — Z23 Encounter for immunization: Secondary | ICD-10-CM | POA: Diagnosis not present

## 2015-12-25 NOTE — Patient Instructions (Signed)
   GO TO THE FRONT DESK Schedule your next appointment for a  PHYSICAL  In 6 to 8 months

## 2015-12-25 NOTE — Assessment & Plan Note (Signed)
HTN:  Since the last office visit, renal US and  microalbumin negative. Ambulatory BPs less than 130s/80s, good medication compliance. Creatinine remains stable, likely slightly higher than "normal" due to large muscle mass. No change. Flu shot today RTC 6-8 months, CPX

## 2015-12-25 NOTE — Progress Notes (Signed)
   Subjective:    Patient ID: Justin Elliott, male    DOB: 24-Nov-1965, 50 y.o.   MRN: [id number]  DOS:  12/25/2015 Type of visit - description : bp f/u Interval history:   Since last office visit he is doing great. Ambulatory BPs [id number], [id number] w/ diastolic of [id number]. Labs  and ultrasound results reviewed.  Review of Systems He remains active, has a healthy lifestyle, no edema  Past Medical History:  Diagnosis Date  . HTN (hypertension) 2009    Past Surgical History:  Procedure Laterality Date  . FINGER SURGERY Right 1984    Social History   Social History  . Marital status: Married    Spouse name: N/A  . Number of children: 2  . Years of education: N/A   Occupational History  . [employer]    Social History Main Topics  . Smoking status: Never Smoker  . Smokeless tobacco: Never Used  . Alcohol use No  . Drug use: No  . Sexual activity: Not on file   Other Topics Concern  . Not on file   Social History Narrative   Household-- wife, 2 children  D 2003, S 1998        Medication List       Accurate as of 12/25/15  8:30 AM. Always use your most recent med list.          hydrochlorothiazide 25 MG tablet Commonly known as:  HYDRODIURIL Take 1 tablet (25 mg total) by mouth daily.          Objective:   Physical Exam BP 122/78 (BP Location: Left Arm, Patient Position: Sitting, Cuff Size: Normal)   Pulse 79   Temp 97.9 F (36.6 C) (Oral)   Resp 12   Ht 5\' 6"  (1.676 m)   Wt 229 lb (103.9 kg)   SpO2 95%   BMI 36.96 kg/m  General:   Well developed, well nourished . NAD.  HEENT:  Normocephalic . Face symmetric, atraumatic Lungs:  CTA B Normal respiratory effort, no intercostal retractions, no accessory muscle use. Heart: RRR,  no murmur.  No pretibial edema bilaterally  Skin: Not pale. Not jaundice Neurologic:  alert & oriented X3.  Speech normal, gait appropriate for age and unassisted Psych--  Cognition and judgment appear  intact.  Cooperative with normal attention span and concentration.  Behavior appropriate. No anxious or depressed appearing.      Assessment & Plan:   Assessment: HTN  Plan HTN:  Since the last office visit, renal US and  microalbumin negative. Ambulatory BPs less than 130s/80s, good medication compliance. Creatinine remains stable, likely slightly higher than "normal" due to large muscle mass. No change. Flu shot today RTC 6-8 months, CPX

## 2015-12-25 NOTE — Progress Notes (Signed)
Pre visit review using our clinic review tool, if applicable. No additional management support is needed unless otherwise documented below in the visit note. 

## 2016-07-06 ENCOUNTER — Ambulatory Visit (INDEPENDENT_AMBULATORY_CARE_PROVIDER_SITE_OTHER): Payer: BC Managed Care – PPO | Admitting: Internal Medicine

## 2016-07-06 ENCOUNTER — Encounter: Payer: Self-pay | Admitting: Internal Medicine

## 2016-07-06 VITALS — BP 124/68 | HR 80 | Temp 97.8°F | Resp 14 | Ht 66.0 in | Wt 229.4 lb

## 2016-07-06 DIAGNOSIS — Z0000 Encounter for general adult medical examination without abnormal findings: Secondary | ICD-10-CM

## 2016-07-06 LAB — BASIC METABOLIC PANEL
BUN: 15 mg/dL (ref 6–23)
CALCIUM: 9.8 mg/dL (ref 8.4–10.5)
CHLORIDE: 106 meq/L (ref 96–112)
CO2: 26 mEq/L (ref 19–32)
CREATININE: 1.45 mg/dL (ref 0.40–1.50)
GFR: 54.42 mL/min — AB (ref >60.00)
Glucose, Bld: 103 mg/dL — ABNORMAL HIGH (ref 70–99)
Potassium: 3.9 mEq/L (ref 3.5–5.1)
Sodium: 139 mEq/L (ref 135–145)

## 2016-07-06 LAB — CBC WITH DIFFERENTIAL/PLATELET
BASOS PCT: 0.6 % (ref 0.0–3.0)
Basophils Absolute: 0 10*3/uL (ref 0.0–0.1)
EOS PCT: 1.5 % (ref 0.0–5.0)
Eosinophils Absolute: 0.1 10*3/uL (ref 0.0–0.7)
HEMATOCRIT: 42.5 % (ref 39.0–52.0)
Hemoglobin: 14.4 g/dL (ref 13.0–17.0)
LYMPHS ABS: 1.6 10*3/uL (ref 0.7–4.0)
Lymphocytes Relative: 47.1 % — ABNORMAL HIGH (ref 12.0–46.0)
MCHC: 33.8 g/dL (ref 30.0–36.0)
MCV: 83.5 fl (ref 78.0–100.0)
MONOS PCT: 6.4 % (ref 3.0–12.0)
Monocytes Absolute: 0.2 10*3/uL (ref 0.1–1.0)
NEUTROS ABS: 1.5 10*3/uL (ref 1.4–7.7)
NEUTROS PCT: 44.4 % (ref 43.0–77.0)
PLATELETS: 212 10*3/uL (ref 150.0–400.0)
RBC: 5.08 Mil/uL (ref 4.22–5.81)
RDW: 13 % (ref 11.5–15.5)
WBC: 3.4 10*3/uL — ABNORMAL LOW (ref 4.0–10.5)

## 2016-07-06 LAB — LIPID PANEL
CHOL/HDL RATIO: 7
CHOLESTEROL: 191 mg/dL (ref 0–200)
HDL: 26.9 mg/dL — ABNORMAL LOW (ref >39.00)
NonHDL: 164.04
TRIGLYCERIDES: 216 mg/dL — AB (ref 0.0–149.0)
VLDL: 43.2 mg/dL — ABNORMAL HIGH (ref 0.0–40.0)

## 2016-07-06 LAB — TSH: TSH: 1.8 u[IU]/mL (ref 0.35–4.50)

## 2016-07-06 LAB — LDL CHOLESTEROL, DIRECT: Direct LDL: 117 mg/dL

## 2016-07-06 MED ORDER — HYDROCHLOROTHIAZIDE 25 MG PO TABS: 25.0000 mg | ORAL_TABLET | Freq: Every day | ORAL | 3 refills | Status: DC

## 2016-07-06 NOTE — Progress Notes (Signed)
Pre visit review using our clinic review tool, if applicable. No additional management support is needed unless otherwise documented below in the visit note. 

## 2016-07-06 NOTE — Patient Instructions (Signed)
GO TO THE LAB : Get the blood work     GO TO THE FRONT DESK Schedule your next appointment for a  physical exam in one year     Check the  blood pressure   monthly   Be sure your blood pressure is between 110/65 and  145/85. If it is consistently higher or lower, let me know

## 2016-07-06 NOTE — Progress Notes (Signed)
   Subjective:    Patient ID: Justin Elliott, male    DOB: [birth year], 51 y.o.   MRN: [id number]  DOS:  07/06/2016 Type of visit - description : cpx Interval history: Feeling well, no major concerns, good medication compliance.   Review of Systems Occasionally allergies due to grass, usually has a rash on exposed areas , +t itching. After the rash is gone he has some hyperpigmentation.   Other than above, a 14 point review of systems is negative     Past Medical History:  Diagnosis Date  . HTN (hypertension) 2009    Past Surgical History:  Procedure Laterality Date  . FINGER SURGERY Right 1984    Social History   Social History  . Marital status: Married    Spouse name: N/A  . Number of children: 2  . Years of education: N/A   Occupational History  . [employer]    Social History Main Topics  . Smoking status: Never Smoker  . Smokeless tobacco: Never Used  . Alcohol use No  . Drug use: No  . Sexual activity: Not on file   Other Topics Concern  . Not on file   Social History Narrative   Household-- wife, 2 children  D 2003, S 1998   PHD 2017     Family History  Problem Relation Age of Onset  . Prostate cancer Father 34       died 04/06/2015 age 62  . Breast cancer Father   . Breast cancer Sister   . Diabetes type I Son   . Stroke Mother   . Colon cancer Neg Hx   . CAD Neg Hx      Allergies as of 07/06/2016   No Known Allergies     Medication List       Accurate as of 07/06/16 11:59 PM. Always use your most recent med list.          hydrochlorothiazide 25 MG tablet Commonly known as:  HYDRODIURIL Take 1 tablet (25 mg total) by mouth daily.          Objective:   Physical Exam BP 124/68 (BP Location: Left Arm, Patient Position: Sitting, Cuff Size: Normal)   Pulse 80   Temp 97.8 F (36.6 C) (Oral)   Resp 14   Ht 5\' 6"  (1.676 m)   Wt 229 lb 6 oz (104 kg)   SpO2 98%   BMI 37.02 kg/m   General:   Well developed,  well nourished . NAD.  Neck: No  thyromegaly  HEENT:  Normocephalic . Face symmetric, atraumatic Lungs:  CTA B Normal respiratory effort, no intercostal retractions, no accessory muscle use. Heart: RRR,  no murmur.  No pretibial edema bilaterally  Abdomen:  Not distended, soft, non-tender. No rebound or rigidity.   Skin: Exposed areas without rash. Not pale. Not jaundice Neurologic:  alert & oriented X3.  Speech normal, gait appropriate for age and unassisted Strength symmetric and appropriate for age.  Psych: Cognition and judgment appear intact.  Cooperative with normal attention span and concentration.  Behavior appropriate. No anxious or depressed appearing.    Assessment & Plan:    Assessment: HTN Slightly increased creatinine: Renal US2017 wnl, microalbumin (-). Likely d/t large muscle mass  PLAN HTN:  Seems well-controlled on HCTZ, RFs, labs. RTC one year

## 2016-07-06 NOTE — Assessment & Plan Note (Addendum)
--  Td 2014 -- + FH prostate and breast cancer, also TIA stroke. --CCS: cscope 08-2015, 10 years --Prostate cancer screening: DRE 2017 (-) . PSA had a slt increased velocity but last  PSA 2017 wnl, reassess next year --Diet, Exercise: Discussed --Labs: BMP, FLP, CBC, TSH

## 2016-07-07 NOTE — Assessment & Plan Note (Signed)
HTN:  Seems well-controlled on HCTZ, RFs, labs. RTC one year

## 2016-08-09 ENCOUNTER — Ambulatory Visit (INDEPENDENT_AMBULATORY_CARE_PROVIDER_SITE_OTHER): Payer: BC Managed Care – PPO | Admitting: Internal Medicine

## 2016-08-09 ENCOUNTER — Encounter: Payer: Self-pay | Admitting: Internal Medicine

## 2016-08-09 VITALS — BP 118/72 | HR 83 | Temp 98.1°F | Resp 14 | Ht 66.0 in | Wt 224.1 lb

## 2016-08-09 DIAGNOSIS — M109 Gout, unspecified: Secondary | ICD-10-CM | POA: Diagnosis not present

## 2016-08-09 DIAGNOSIS — I10 Essential (primary) hypertension: Secondary | ICD-10-CM

## 2016-08-09 LAB — BASIC METABOLIC PANEL
BUN: 18 mg/dL (ref 6–23)
CALCIUM: 10.2 mg/dL (ref 8.4–10.5)
CO2: 26 meq/L (ref 19–32)
Chloride: 103 mEq/L (ref 96–112)
Creatinine, Ser: 1.61 mg/dL — ABNORMAL HIGH (ref 0.40–1.50)
GFR: 48.21 mL/min — ABNORMAL LOW (ref >60.00)
GLUCOSE: 100 mg/dL — AB (ref 70–99)
Potassium: 3.7 mEq/L (ref 3.5–5.1)
SODIUM: 138 meq/L (ref 135–145)

## 2016-08-09 LAB — URIC ACID: Uric Acid, Serum: 10.1 mg/dL — ABNORMAL HIGH (ref 4.0–7.8)

## 2016-08-09 MED ORDER — PREDNISONE 10 MG PO TABS: ORAL_TABLET | ORAL | 0 refills | Status: DC

## 2016-08-09 MED ORDER — CARVEDILOL 12.5 MG PO TABS: 12.5000 mg | ORAL_TABLET | Freq: Two times a day (BID) | ORAL | 3 refills | Status: DC

## 2016-08-09 MED ORDER — COLCHICINE 0.6 MG PO TABS: 0.6000 mg | ORAL_TABLET | Freq: Two times a day (BID) | ORAL | 1 refills | Status: DC | PRN

## 2016-08-09 NOTE — Progress Notes (Signed)
Pre visit review using our clinic review tool, if applicable. No additional management support is needed unless otherwise documented below in the visit note. 

## 2016-08-09 NOTE — Patient Instructions (Signed)
GO TO THE LAB : Get the blood work     GO TO THE FRONT DESK Schedule your next appointment for a  checkup in 6 weeks  Stop hydrochlorothiazide Start carvedilol to control your blood pressure  For gout: Feet elevation ICE Prednisone for a few days Colchicine twice a day until better  Call if not gradually better   Gout Gout is painful swelling that can happen in some of your joints. Gout is a type of arthritis. This condition is caused by having too much uric acid in your body. Uric acid is a chemical that is made when your body breaks down substances called purines. If your body has too much uric acid, sharp crystals can form and build up in your joints. This causes pain and swelling. Gout attacks can happen quickly and be very painful (acute gout). Over time, the attacks can affect more joints and happen more often (chronic gout). Follow these instructions at home: During a Gout Attack  If directed, put ice on the painful area: ? Put ice in a plastic bag. ? Place a towel between your skin and the bag. ? Leave the ice on for 20 minutes, 2-3 times a day.  Rest the joint as much as possible. If the joint is in your leg, you may be given crutches to use.  Raise (elevate) the painful joint above the level of your heart as often as you can.  Drink enough fluids to keep your pee (urine) clear or pale yellow.  Take over-the-counter and prescription medicines only as told by your doctor.  Do not drive or use heavy machinery while taking prescription pain medicine.  Follow instructions from your doctor about what you can or cannot eat and drink.  Return to your normal activities as told by your doctor. Ask your doctor what activities are safe for you. Avoiding Future Gout Attacks  Follow a low-purine diet as told by a specialist (dietitian) or your doctor. Avoid foods and drinks that have a lot of purines, such  as: ? Liver. ? Kidney. ? Anchovies. ? Asparagus. ? Herring. ? Mushrooms ? Mussels. ? Beer.  Limit alcohol intake to no more than 1 drink a day for nonpregnant women and 2 drinks a day for men. One drink equals 12 oz of beer, 5 oz of wine, or 1 oz of hard liquor.  Stay at a healthy weight or lose weight if you are overweight. If you want to lose weight, talk with your doctor. It is important that you do not lose weight too fast.  Start or continue an exercise plan as told by your doctor.  Drink enough fluids to keep your pee clear or pale yellow.  Take over-the-counter and prescription medicines only as told by your doctor.  Keep all follow-up visits as told by your doctor. This is important. Contact a doctor if:  You have another gout attack.  You still have symptoms of a gout attack after10 days of treatment.  You have problems (side effects) because of your medicines.  You have chills or a fever.  You have burning pain when you pee (urinate).  You have pain in your lower back or belly. Get help right away if:  You have very bad pain.  Your pain cannot be controlled.  You cannot pee. This information is not intended to replace advice given to you by your health care provider. Make sure you discuss any questions you have with your health care provider. Document Released: 11/03/2007  Document Revised: 07/02/2015 Document Reviewed: 11/06/2014 Elsevier Interactive Patient Education  [employer].

## 2016-08-09 NOTE — Progress Notes (Signed)
Subjective:    Patient ID: Justin Elliott, male    DOB: 01-05-1966, 51 y.o.   MRN: [id number]  DOS:  08/09/2016 Type of visit - description : acute Interval history: About 2 weeks ago developed left foot pain and swelling, worse at the base of the great toe. As the L foot was  improving, he started develop similar symptoms on the right foot. The area was swollen, hot and tender to touch. No history of previous gout, has not taken any medications.  Review of Systems  Denies fever, chills. No injury or overuse. Denies any calf pain. Not taking any new medications. On HCTZ chronically   Past Medical History:  Diagnosis Date  . HTN (hypertension) 2009    Past Surgical History:  Procedure Laterality Date  . FINGER SURGERY Right 1984    Social History   Social History  . Marital status: Married    Spouse name: N/A  . Number of children: 2  . Years of education: N/A   Occupational History  . [employer]    Social History Main Topics  . Smoking status: Never Smoker  . Smokeless tobacco: Never Used  . Alcohol use No  . Drug use: No  . Sexual activity: Not on file   Other Topics Concern  . Not on file   Social History Narrative   Household-- wife, 2 children  D 2003, S 1998   PHD 2017      Allergies as of 08/09/2016   No Known Allergies     Medication List       Accurate as of 08/09/16 11:59 PM. Always use your most recent med list.          carvedilol 12.5 MG tablet Commonly known as:  COREG Take 1 tablet (12.5 mg total) by mouth 2 (two) times daily with a meal.   colchicine 0.6 MG tablet Take 1 tablet (0.6 mg total) by mouth 2 (two) times daily as needed. OK TO USE BRAND MITIGARE   predniSONE 10 MG tablet Commonly known as:  DELTASONE 4 tablets x 2 days, 3 tabs x 2 days, 2 tabs x 2 days, 1 tab x 2 days          Objective:   Physical Exam BP 118/72 (BP Location: Left Arm, Patient Position: Sitting, Cuff Size: Normal)    Pulse 83   Temp 98.1 F (36.7 C) (Oral)   Resp 14   Ht 5\' 6"  (1.676 m)   Wt 224 lb 2 oz (101.7 kg)   SpO2 98%   BMI 36.17 kg/m  General:   Well developed, well nourished . NAD.  HEENT:  Normocephalic . Face symmetric, atraumatic Vascular: Good pedal pulses bilaterally, MSK: Calves symmetric Right foot: Mildly swollen, warm distally, the base of the great toe is exquisitely TTP, more swollen and red than the rest of the foot. Left foot: Mildly swollen and warm distally, mildly worse at the base of the right great toe however the area is not exquisitely tender. Skin: Not pale. Not jaundice Neurologic:  alert & oriented X3.  Speech normal, gait appropriate for age and unassisted Psych--  Cognition and judgment appear intact.  Cooperative with normal attention span and concentration.  Behavior appropriate. No anxious or depressed appearing.      Assessment & Plan:   Assessment: HTN Slightly increased creatinine: Renal US2017 wnl, microalbumin (-). Likely d/t large muscle mass  PLAN Gout: Suspect first episode of gout. What this condition is,  treatment and diet discussed. Rx prednisone, colchicine, ice. Check a BMP and uric acid . Call if not gradually improving. Educated about diet HTN: On HCTZ for a while, will stop it due to gout and start carvedilol instead RTC 6 weeks  Today, I spent more than 25   min with the patient: >50% of the time counseling regards   New dx  of gout, patient education particularly diet.

## 2016-08-10 NOTE — Assessment & Plan Note (Signed)
Gout: Suspect first episode of gout. What this condition is, treatment and diet discussed. Rx prednisone, colchicine, ice. Check a BMP and uric acid . Call if not gradually improving. Educated about diet HTN: On HCTZ for a while, will stop it due to gout and start carvedilol instead RTC 6 weeks

## 2016-08-18 ENCOUNTER — Telehealth: Payer: Self-pay | Admitting: Internal Medicine

## 2016-08-18 NOTE — Telephone Encounter (Signed)
Patient called with questions about lab results

## 2016-08-18 NOTE — Telephone Encounter (Signed)
Notes recorded by Damita Dunnings, [place] on 08/11/2016 at 8:24 AM EDT Letter printed and mailed to Pt. ------  Notes recorded by Colon Branch, MD on 08/10/2016 at 2:26 PM EDT Send a letter Sonia Side, your blood tests showed the uric acid is elevated making the diagnosis of gout more likely. Plan is the same.

## 2016-08-18 NOTE — Telephone Encounter (Signed)
LMOM informing Pt of lab results. Instructed to call if questions/concerns.  

## 2016-09-20 ENCOUNTER — Encounter: Payer: Self-pay | Admitting: Internal Medicine

## 2016-09-20 ENCOUNTER — Ambulatory Visit (INDEPENDENT_AMBULATORY_CARE_PROVIDER_SITE_OTHER): Payer: BC Managed Care – PPO | Admitting: Internal Medicine

## 2016-09-20 VITALS — BP 124/78 | HR 67 | Temp 97.8°F | Resp 14 | Ht 66.0 in | Wt 224.2 lb

## 2016-09-20 DIAGNOSIS — M109 Gout, unspecified: Secondary | ICD-10-CM

## 2016-09-20 DIAGNOSIS — I10 Essential (primary) hypertension: Secondary | ICD-10-CM

## 2016-09-20 DIAGNOSIS — N289 Disorder of kidney and ureter, unspecified: Secondary | ICD-10-CM | POA: Insufficient documentation

## 2016-09-20 DIAGNOSIS — R7989 Other specified abnormal findings of blood chemistry: Secondary | ICD-10-CM | POA: Diagnosis not present

## 2016-09-20 LAB — BASIC METABOLIC PANEL
BUN: 13 (ref 4–21)
BUN: 13 mg/dL (ref 6–23)
CHLORIDE: 105 meq/L (ref 96–112)
CO2: 28 mEq/L (ref 19–32)
CREATININE: 1.37 mg/dL (ref 0.40–1.50)
Calcium: 9.8 mg/dL (ref 8.4–10.5)
Creatinine: 1.4 — AB (ref 0.6–1.3)
GFR: 58.05 mL/min — ABNORMAL LOW (ref >60.00)
GLUCOSE: 105
Glucose, Bld: 105 mg/dL — ABNORMAL HIGH (ref 70–99)
POTASSIUM: 4 meq/L (ref 3.5–5.1)
POTASSIUM: 4 (ref 3.4–5.3)
SODIUM: 140 (ref 137–147)
Sodium: 140 mEq/L (ref 135–145)

## 2016-09-20 LAB — URIC ACID: URIC ACID, SERUM: 9.8 mg/dL — AB (ref 4.0–7.8)

## 2016-09-20 NOTE — Patient Instructions (Signed)
GO TO THE LAB : Get the blood work     GO TO THE FRONT DESK Schedule your next appointment for a  Physical exam 06-2017

## 2016-09-20 NOTE — Progress Notes (Signed)
   Subjective:    Patient ID: Justin Elliott, male    DOB: 04/20/1965, 51 y.o.   MRN: [id number]  DOS:  09/20/2016 Type of visit - description : Follow-up Interval history: Gout: Since the last visit, took colchicine and prednisone, symptoms improved and has not come back Increase creatinine: See labs, patient concerned. HTN: Good medication compliance, normal ambulatory BPs   Review of Systems No chest pain or difficulty breathing No nausea, vomiting, diarrhea  Past Medical History:  Diagnosis Date  . HTN (hypertension) 2009    Past Surgical History:  Procedure Laterality Date  . FINGER SURGERY Right 1984    Social History   Social History  . Marital status: Married    Spouse name: N/A  . Number of children: 2  . Years of education: N/A   Occupational History  . [employer]    Social History Main Topics  . Smoking status: Never Smoker  . Smokeless tobacco: Never Used  . Alcohol use No  . Drug use: No  . Sexual activity: Not on file   Other Topics Concern  . Not on file   Social History Narrative   Household-- wife, 2 children  D 2003, S 1998   PHD 2017      Allergies as of 09/20/2016   No Known Allergies     Medication List       Accurate as of 09/20/16  6:36 PM. Always use your most recent med list.          carvedilol 12.5 MG tablet Commonly known as:  COREG Take 1 tablet (12.5 mg total) by mouth 2 (two) times daily with a meal.   colchicine 0.6 MG tablet Take 1 tablet (0.6 mg total) by mouth 2 (two) times daily as needed. OK TO USE BRAND MITIGARE          Objective:   Physical Exam BP 124/78 (BP Location: Left Arm, Patient Position: Sitting, Cuff Size: Normal)   Pulse 67   Temp 97.8 F (36.6 C) (Oral)   Resp 14   Ht 5\' 6"  (1.676 m)   Wt 224 lb 4 oz (101.7 kg)   SpO2 96%   BMI 36.19 kg/m  General:   Well developed, well nourished . NAD.  HEENT:  Normocephalic . Face symmetric, atraumatic Lungs:  CTA  B Normal respiratory effort, no intercostal retractions, no accessory muscle use. Heart: RRR,  no murmur.  No pretibial edema bilaterally  Skin: Not pale. Not jaundice Neurologic:  alert & oriented X3.  Speech normal, gait appropriate for age and unassisted Psych--  Cognition and judgment appear intact.  Cooperative with normal attention span and concentration.  Behavior appropriate. No anxious or depressed appearing.      Assessment & Plan:   Assessment: HTN Gout: DX 08-2016 Slightly increased creatinine: Renal US 2017 wnl, microalbumin (-). Likely d/t large muscle mass  PLAN Gout: See last visit, sx c/w gout, uric acid 10.1, they resolved w/ prednisone, colchicine and change in diet Plan: Check another uric acid, colchicine as needed, continue with a "gout diet". Consider allopurinol if frequent episodes or if uric acid  remains very high. HTN: Well-controlled on carvedilol Increase creatinine: Patient is concerned regards increased creatinine, will refer to nephrology for a opinion , to be sure there is no renal dysfunction. Increase creat could be related to hyperuricemia. RTC 06-2016, CPX

## 2016-09-20 NOTE — Assessment & Plan Note (Signed)
Gout: See last visit, sx c/w gout, uric acid 10.1, they resolved w/ prednisone, colchicine and change in diet Plan: Check another uric acid, colchicine as needed, continue with a "gout diet". Consider allopurinol if frequent episodes or if uric acid  remains very high. HTN: Well-controlled on carvedilol Increase creatinine: Patient is concerned regards increased creatinine, will refer to nephrology for a opinion , to be sure there is no renal dysfunction. Increase creat could be related to hyperuricemia. RTC 06-2016, CPX

## 2016-09-20 NOTE — Progress Notes (Signed)
Pre visit review using our clinic review tool, if applicable. No additional management support is needed unless otherwise documented below in the visit note. 

## 2016-11-11 ENCOUNTER — Other Ambulatory Visit: Payer: Self-pay | Admitting: Internal Medicine

## 2016-12-20 ENCOUNTER — Encounter: Payer: Self-pay | Admitting: Internal Medicine

## 2017-07-10 ENCOUNTER — Encounter: Payer: Self-pay | Admitting: Internal Medicine

## 2017-07-10 ENCOUNTER — Ambulatory Visit (INDEPENDENT_AMBULATORY_CARE_PROVIDER_SITE_OTHER): Payer: BC Managed Care – PPO | Admitting: Internal Medicine

## 2017-07-10 VITALS — BP 124/76 | HR 74 | Temp 98.2°F | Resp 14 | Ht 66.0 in | Wt 231.2 lb

## 2017-07-10 DIAGNOSIS — Z0000 Encounter for general adult medical examination without abnormal findings: Secondary | ICD-10-CM | POA: Diagnosis not present

## 2017-07-10 DIAGNOSIS — M1000 Idiopathic gout, unspecified site: Secondary | ICD-10-CM | POA: Diagnosis not present

## 2017-07-10 LAB — CBC WITH DIFFERENTIAL/PLATELET
BASOS ABS: 0 10*3/uL (ref 0.0–0.1)
Basophils Relative: 0.6 % (ref 0.0–3.0)
Eosinophils Absolute: 0.1 10*3/uL (ref 0.0–0.7)
Eosinophils Relative: 2 % (ref 0.0–5.0)
HEMATOCRIT: 41.3 % (ref 39.0–52.0)
Hemoglobin: 13.9 g/dL (ref 13.0–17.0)
LYMPHS PCT: 41.2 % (ref 12.0–46.0)
Lymphs Abs: 1.6 10*3/uL (ref 0.7–4.0)
MCHC: 33.7 g/dL (ref 30.0–36.0)
MCV: 84.3 fl (ref 78.0–100.0)
MONOS PCT: 8.2 % (ref 3.0–12.0)
Monocytes Absolute: 0.3 10*3/uL (ref 0.1–1.0)
Neutro Abs: 1.9 10*3/uL (ref 1.4–7.7)
Neutrophils Relative %: 48 % (ref 43.0–77.0)
Platelets: 197 10*3/uL (ref 150.0–400.0)
RBC: 4.9 Mil/uL (ref 4.22–5.81)
RDW: 13.6 % (ref 11.5–15.5)
WBC: 3.9 10*3/uL — ABNORMAL LOW (ref 4.0–10.5)

## 2017-07-10 LAB — COMPREHENSIVE METABOLIC PANEL
ALBUMIN: 3.9 g/dL (ref 3.5–5.2)
ALT: 16 U/L (ref 0–53)
AST: 15 U/L (ref 0–37)
Alkaline Phosphatase: 63 U/L (ref 39–117)
BUN: 14 mg/dL (ref 6–23)
CHLORIDE: 103 meq/L (ref 96–112)
CO2: 28 meq/L (ref 19–32)
CREATININE: 1.43 mg/dL (ref 0.40–1.50)
Calcium: 9.2 mg/dL (ref 8.4–10.5)
GFR: 55.08 mL/min — ABNORMAL LOW (ref >60.00)
Glucose, Bld: 99 mg/dL (ref 70–99)
Potassium: 3.8 mEq/L (ref 3.5–5.1)
Sodium: 139 mEq/L (ref 135–145)
Total Bilirubin: 0.6 mg/dL (ref 0.2–1.2)
Total Protein: 6.5 g/dL (ref 6.0–8.3)

## 2017-07-10 LAB — URIC ACID: URIC ACID, SERUM: 11.6 mg/dL — AB (ref 4.0–7.8)

## 2017-07-10 LAB — URINALYSIS, ROUTINE W REFLEX MICROSCOPIC
Bilirubin Urine: NEGATIVE
HGB URINE DIPSTICK: NEGATIVE
Ketones, ur: NEGATIVE
LEUKOCYTES UA: NEGATIVE
Nitrite: NEGATIVE
RBC / HPF: NONE SEEN (ref 0–?)
Specific Gravity, Urine: 1.02 (ref 1.000–1.030)
TOTAL PROTEIN, URINE-UPE24: NEGATIVE
URINE GLUCOSE: NEGATIVE
Urobilinogen, UA: 0.2 (ref 0.0–1.0)
WBC, UA: NONE SEEN (ref 0–?)
pH: 6 (ref 5.0–8.0)

## 2017-07-10 LAB — LIPID PANEL
CHOL/HDL RATIO: 7
Cholesterol: 166 mg/dL (ref 0–200)
HDL: 23.5 mg/dL — ABNORMAL LOW (ref >39.00)
NONHDL: 142.52
Triglycerides: 265 mg/dL — ABNORMAL HIGH (ref 0.0–149.0)
VLDL: 53 mg/dL — ABNORMAL HIGH (ref 0.0–40.0)

## 2017-07-10 LAB — PSA: PSA: 1.97 ng/mL (ref 0.10–4.00)

## 2017-07-10 LAB — LDL CHOLESTEROL, DIRECT: Direct LDL: 86 mg/dL

## 2017-07-10 MED ORDER — CARVEDILOL 25 MG PO TABS: 25.0000 mg | ORAL_TABLET | Freq: Two times a day (BID) | ORAL | 6 refills | Status: DC

## 2017-07-10 NOTE — Progress Notes (Signed)
Subjective:    Patient ID: Justin Elliott, male    DOB: [birth year], 52 y.o.   MRN: [id number]  DOS:  07/10/2017 Type of visit - description : CPX Interval history: No major concerns   Review of Systems History of hyperpigmentation patch on the forehead.  No itching, no blisters.  Other than above, a 14 point review of systems is negative      Past Medical History:  Diagnosis Date  . HTN (hypertension) 2009    Past Surgical History:  Procedure Laterality Date  . FINGER SURGERY Right 1984    Social History   Socioeconomic History  . Marital status: Married    Spouse name: Not on file  . Number of children: 2  . Years of education: Not on file  . Highest education level: Not on file  Occupational History  . Occupation: [employer]  Social Needs  . Financial resource strain: Not on file  . Food insecurity:    Worry: Not on file    Inability: Not on file  . Transportation needs:    Medical: Not on file    Non-medical: Not on file  Tobacco Use  . Smoking status: Never Smoker  . Smokeless tobacco: Never Used  Substance and Sexual Activity  . Alcohol use: No    Alcohol/week: 0.0 oz  . Drug use: No  . Sexual activity: Not on file  Lifestyle  . Physical activity:    Days per week: Not on file    Minutes per session: Not on file  . Stress: Not on file  Relationships  . Social connections:    Talks on phone: Not on file    Gets together: Not on file    Attends religious service: Not on file    Active member of club or organization: Not on file    Attends meetings of clubs or organizations: Not on file    Relationship status: Not on file  . Intimate partner violence:    Fear of current or ex partner: Not on file    Emotionally abused: Not on file    Physically abused: Not on file    Forced sexual activity: Not on file  Other Topics Concern  . Not on file  Social History Narrative   Household-- wife, 2 children     D 2003 (lives at  home)    [employer] (college)   PHD 2017     Family History  Problem Relation Age of Onset  . Prostate cancer Father 74       died March 21, 2015 age 65  . Breast cancer Father   . Breast cancer Sister   . Diabetes type I Son   . Stroke Mother 65  . Colon cancer Neg Hx   . CAD Neg Hx      Allergies as of 07/10/2017   No Known Allergies     Medication List        Accurate as of 07/10/17  9:37 PM. Always use your most recent med list.          carvedilol 25 MG tablet Commonly known as:  COREG Take 1 tablet (25 mg total) by mouth 2 (two) times daily with a meal.   colchicine 0.6 MG tablet Take 1 tablet (0.6 mg total) by mouth 2 (two) times daily as needed (gout).          Objective:   Physical Exam BP 124/76 (BP Location: Left Arm, Patient Position: Sitting,  Cuff Size: Normal)   Pulse 74   Temp 98.2 F (36.8 C) (Oral)   Resp 14   Ht 5\' 6"  (1.676 m)   Wt 231 lb 4 oz (104.9 kg)   SpO2 97%   BMI 37.32 kg/m  General:   Well developed, well nourished . NAD.  Neck: No  thyromegaly  HEENT:  Normocephalic . Face symmetric, atraumatic Lungs:  CTA B Normal respiratory effort, no intercostal retractions, no accessory muscle use. Heart: RRR,  no murmur.  No pretibial edema bilaterally  Abdomen:  Not distended, soft, non-tender. No rebound or rigidity.   Skin: In the center of the forehead he has an area of mild hyperpigmentation, irregular shape, borders not elevated. Rectal: External abnormalities: none. Normal sphincter tone. No rectal masses or tenderness.  No stools found Prostate: Limited exam, hard to reach the prostate but it seems firm and smooth, no enlargement, nodularity, tenderness, mass, asymmetry or induration Neurologic:  alert & oriented X3.  Speech normal, gait appropriate for age and unassisted Strength symmetric and appropriate for age.  Psych: Cognition and judgment appear intact.  Cooperative with normal attention span and concentration.  Behavior  appropriate. No anxious or depressed appearing.     Assessment & Plan:   Assessment: HTN Gout: DX 08-2016 Slightly increased creatinine:  Renal US 2017 wnl, microalbumin (-). Likely d/t large muscle mass. Saw nephrology 11/2016, they rec good BP control, treat elevated cholesterol if needed, weight loss, avoid NSAIDs, keep hydrated, consider treatment w/ uric acid lowering meds  PLAN HTN: At home BPs are consistently  in the high side, 140, 150.  Patient suspect he has a small cuff. BP today is very good. Plan: Increase carvedilol to 25 mg twice a day, monitor BPs with a large cuff, at rest, twice a week, call if BP elevated, see AVS.  Low-salt diet Gout: Asymptomatic, checking uric acid, continue colchicine prn. Consider Rx for hyperuricemia Elevated  creatinine:    Saw nephrology 11/2016, they rec good BP control, treat elevated cholesterol if needed, weight loss, avoid NSAIDs, keep hydrated, consider treatment w/ uric acid lowering meds Hyperpigmentation, forehead: Unclear etiology, for now recommend sunscreen use and reassess periodically. RTC 6 months mostly to monitor BP   Gout: See last visit, sx c/w gout, uric acid 10.1, they resolved w/ prednisone, colchicine and change in diet Plan: Check another uric acid, colchicine as needed, continue with a "gout diet". Consider allopurinol if frequent episodes or if uric acid  remains very high. HTN: Well-controlled on carvedilol Increase creatinine: Patient is concerned regards increased creatinine, will refer to nephrology for a opinion , to be sure there is no renal dysfunction. Increase creat could be related to hyperuricemia. RTC 06-2016, CPX

## 2017-07-10 NOTE — Assessment & Plan Note (Signed)
HTN: At home BPs are consistently  in the high side, 140, 150.  Patient suspect he has a small cuff. BP today is very good. Plan: Increase carvedilol to 25 mg twice a day, monitor BPs with a large cuff, at rest, twice a week, call if BP elevated, see AVS.  Low-salt diet Gout: Asymptomatic, checking uric acid, continue colchicine prn. Consider Rx for hyperuricemia Elevated  creatinine:    Saw nephrology 11/2016, they rec good BP control, treat elevated cholesterol if needed, weight loss, avoid NSAIDs, keep hydrated, consider treatment w/ uric acid lowering meds Hyperpigmentation, forehead: Unclear etiology, for now recommend sunscreen use and reassess periodically. RTC 6 months mostly to monitor BP

## 2017-07-10 NOTE — Progress Notes (Signed)
Pre visit review using our clinic review tool, if applicable. No additional management support is needed unless otherwise documented below in the visit note. 

## 2017-07-10 NOTE — Assessment & Plan Note (Signed)
--  Td 2014 -- + FH prostate and breast cancer, also TIA stroke. --CCS: cscope 08-2015, 10 years --Prostate cancer screening: DRE wnl today, check a PSA   --Diet, Exercise: Discussed --Labs: CMP, FLP, CBC, uric acid, PSA, UA

## 2017-07-10 NOTE — Patient Instructions (Addendum)
GO TO THE LAB : Get the blood work     GO TO THE FRONT DESK Schedule your next appointment for a follow-up in 6 months    Check the  blood pressure 2 times a week after resting for 15 minutes Use a large cuff  be sure your blood pressure is between 110/65 and  135/85. If it is consistently higher or lower, let me know

## 2017-07-18 ENCOUNTER — Other Ambulatory Visit: Payer: Self-pay

## 2017-07-18 DIAGNOSIS — E790 Hyperuricemia without signs of inflammatory arthritis and tophaceous disease: Secondary | ICD-10-CM

## 2017-10-27 ENCOUNTER — Ambulatory Visit: Payer: Self-pay | Admitting: Internal Medicine

## 2017-10-27 NOTE — Telephone Encounter (Signed)
Pt. called to report elevated BP readings since his medication he was changed approx. 2 weeks ago, per Nephrology.  Reported BP at 7:15 AM was 170/110.  Has not rechecked it.  Stated "I don't feel my best."  C/o headache.  Denied dizziness, blurred vision, or unilateral weakness of extremities.  Denied chest pain or shortness of breath.  Denied missing any recent doses of BP medication.  C/o gout in foot. Verb. frustration that he did not feel that Nephrology did anything but talk to him, and change his BP medication.  Pt. Is unable to tell nurse what medication he was changed to by Nephrology.  Pt. Stated he does not want to see any other provider except Dr. Larose Kells.  Advised Dr. Larose Kells does not have any appts. avail. today. Appt. given for 10/30/17 @ 10:20 AM.  Care advice given per protocol.  Pt. very curt when attempting to give care advice, stating "I already do all those things and it is not working." Stated "I can't exercise because I told you I have gout in my foot."   Questioned about sx's?  Reported the sole of his (L) foot "is tender/ sore, and warm to touch."  Unsure if he has had fever.  Stated "it upsets me to have to answer these questions; if I can't see Dr. Larose Kells today, then I feel this is pointless, and I'll discuss it with him on Monday."  Friendship to question which BP medication pt. is on; advised per pharmacist that "pt. picked up Rx for Losartan 25 mg. qd. on 10/23/17."     Will forward note to PCP today, to make him aware of pt's complaints.        Reason for Disposition . Systolic BP  >= 233 OR Diastolic >= 007  Answer Assessment - Initial Assessment Questions 1. BLOOD PRESSURE: "What is the blood pressure?" "Did you take at least two measurements 5 minutes apart?"     170/110 @ 7:15 AM  2. ONSET: "When did you take your blood pressure?"     See above  3. HOW: "How did you obtain the blood pressure?" (e.g., visiting nurse, automatic home BP monitor)     digital 4. HISTORY: "Do you  have a history of high blood pressure?"    Yes 5. MEDICATIONS: "Are you taking any medications for blood pressure?" "Have you missed any doses recently?"     Denied missing doses; new medication was started about 2 weeks 6. OTHER SYMPTOMS: "Do you have any symptoms?" (e.g., headache, chest pain, blurred vision, difficulty breathing, weakness)     "I don't feel like my best."  C/o headache; denied dizziness or blurred vision, denied gen. Weakness or unilateral extremity weakness.   Denied chest pain or SOB.  Protocols used: HIGH BLOOD PRESSURE-A-AH

## 2017-10-27 NOTE — Telephone Encounter (Signed)
Called pt and informed him that we had some cancellations in Dr. Ethel Rana schedule and offered him the 1 o'clock appt. Pt stated that he was now out of town and wouldn't be home until Sunday. Pt stated that he would just keep his 10/30/17 appt.

## 2017-10-27 NOTE — Telephone Encounter (Signed)
I had 2 cancellations, can see him at 1 pm today, let him know

## 2017-10-27 NOTE — Telephone Encounter (Signed)
FYI

## 2017-10-27 NOTE — Telephone Encounter (Signed)
Please call Pt- can be worked in today at [place] had cancellations.

## 2017-10-27 NOTE — Telephone Encounter (Signed)
Noted. Will inform PCP.

## 2017-10-30 ENCOUNTER — Ambulatory Visit: Payer: BC Managed Care – PPO | Admitting: Internal Medicine

## 2017-10-30 ENCOUNTER — Encounter: Payer: Self-pay | Admitting: Internal Medicine

## 2017-10-30 VITALS — BP 152/90 | HR 61 | Temp 97.7°F | Resp 16 | Ht 66.0 in | Wt 232.2 lb

## 2017-10-30 DIAGNOSIS — I10 Essential (primary) hypertension: Secondary | ICD-10-CM

## 2017-10-30 DIAGNOSIS — M109 Gout, unspecified: Secondary | ICD-10-CM | POA: Diagnosis not present

## 2017-10-30 LAB — BASIC METABOLIC PANEL
BUN: 19 mg/dL (ref 6–23)
CALCIUM: 9.7 mg/dL (ref 8.4–10.5)
CO2: 26 meq/L (ref 19–32)
Chloride: 104 mEq/L (ref 96–112)
Creatinine, Ser: 1.54 mg/dL — ABNORMAL HIGH (ref 0.40–1.50)
GFR: 50.5 mL/min — ABNORMAL LOW (ref >60.00)
Glucose, Bld: 103 mg/dL — ABNORMAL HIGH (ref 70–99)
Potassium: 4 mEq/L (ref 3.5–5.1)
SODIUM: 138 meq/L (ref 135–145)

## 2017-10-30 MED ORDER — LOSARTAN POTASSIUM 25 MG PO TABS: 50.0000 mg | ORAL_TABLET | Freq: Every day | ORAL | 0 refills | Status: DC

## 2017-10-30 NOTE — Patient Instructions (Addendum)
GO TO THE LAB : Get the blood work     GO TO THE FRONT DESK Schedule your next appointment for a checkup in 3 months  Continue losartan 25 mg: 2 tablets daily Go back on carvedilol 25 mg 1 tablet twice a day  Check the  blood pressure 2 or 3 times a   week   Be sure your blood pressure is between 110/65 and  135/85. If it is consistently higher or lower, let me know

## 2017-10-30 NOTE — Progress Notes (Signed)
Subjective:    Patient ID: Justin Elliott, male    DOB: [birth year], 52 y.o.   MRN: [id number]  DOS:  10/30/2017 Type of visit - description : acute, BP  Saw nephrology 10/19/2017 at my request because the uric acid was elevated, note reviewed.  He was recommended to switch from Coreg to losartan given hyperuricemia He did follow through w/ the changes, shortly after on 10/22/2017 he developed pain warmness at the lateral aspect of the left foot. That lasted approximately 5 days, he is now essentially back to normal.  He did take colchicine with relief.  He feels that this episode was gout related.  Also, since he switch BP meds, BPs are in the high side, 170, 180.  He increased losartan from 25 mg to 50 mg approximately 5 days ago as recommended by nephrlogy, BP today is a still elevated..  Also noted not a knot @ the scrotal area last week, denies fever, chills, dysuria, gross hematuria.   Review of Systems Had a headache one time last week, that is resolved. No chest pain no difficulty breathing No lower extremity edema  Past Medical History:  Diagnosis Date  . HTN (hypertension) 2009    Past Surgical History:  Procedure Laterality Date  . FINGER SURGERY Right 1984    Social History   Socioeconomic History  . Marital status: Married    Spouse name: Not on file  . Number of children: 2  . Years of education: Not on file  . Highest education level: Not on file  Occupational History  . Occupation: [employer]  Social Needs  . Financial resource strain: Not on file  . Food insecurity:    Worry: Not on file    Inability: Not on file  . Transportation needs:    Medical: Not on file    Non-medical: Not on file  Tobacco Use  . Smoking status: Never Smoker  . Smokeless tobacco: Never Used  Substance and Sexual Activity  . Alcohol use: No    Alcohol/week: 0.0 standard drinks  . Drug use: No  . Sexual activity: Not on file  Lifestyle  . Physical  activity:    Days per week: Not on file    Minutes per session: Not on file  . Stress: Not on file  Relationships  . Social connections:    Talks on phone: Not on file    Gets together: Not on file    Attends religious service: Not on file    Active member of club or organization: Not on file    Attends meetings of clubs or organizations: Not on file    Relationship status: Not on file  . Intimate partner violence:    Fear of current or ex partner: Not on file    Emotionally abused: Not on file    Physically abused: Not on file    Forced sexual activity: Not on file  Other Topics Concern  . Not on file  Social History Narrative   Household-- wife, 2 children     D 2003 (lives at home)    [employer] (college)   PHD 2017      Allergies as of 10/30/2017   No Known Allergies     Medication List        Accurate as of 10/30/17 11:59 PM. Always use your most recent med list.          carvedilol 25 MG tablet Commonly known as:  COREG  Take 1 tablet (25 mg total) by mouth 2 (two) times daily with a meal.   colchicine 0.6 MG tablet Take 1 tablet (0.6 mg total) by mouth 2 (two) times daily as needed (gout).   losartan 25 MG tablet Commonly known as:  COZAAR Take 2 tablets (50 mg total) by mouth daily.          Objective:   Physical Exam BP (!) 152/90 (BP Location: Left Arm, Patient Position: Sitting, Cuff Size: Small)   Pulse 61   Temp 97.7 F (36.5 C) (Oral)   Resp 16   Ht 5\' 6"  (1.676 m)   Wt 232 lb 4 oz (105.3 kg)   SpO2 98%   BMI 37.49 kg/m  General:   Well developed, NAD, see BMI.  HEENT:  Normocephalic . Face symmetric, atraumatic Lungs:  CTA B Normal respiratory effort, no intercostal retractions, no accessory muscle use. Heart: RRR,  no murmur.  No pretibial edema bilaterally  Skin: Not pale. Not jaundice GU: Scrotal contents normal, at the scrotal area near the anus, he has a 1 cm superficial soft mass consistent with a cyst. Neurologic:  alert &  oriented X3.  Speech normal, gait appropriate for age and unassisted Left foot: Normal to inspection and palpation Psych--  Cognition and judgment appear intact.  Cooperative with normal attention span and concentration.  Behavior appropriate. No anxious or depressed appearing.      Assessment & Plan:    Assessment: HTN Gout: DX 08-2016 Slightly increased creatinine:  Renal US 2017 wnl, microalbumin (-). Likely d/t large muscle mass. Saw nephrology 11/2016, they rec good BP control, treat elevated cholesterol if needed, weight loss, avoid NSAIDs, keep hydrated, consider treatment w/ uric acid lowering meds  PLAN HTN, gout, elevated creatinine:  Since the last visit, uric acid was elevated, was referred to nephrology, they decided to switch Coreg to losartan. After that, he had pain at the left foot, he felt it was related to gout; symptoms are resolved and exam today is normal.. Currently taking losartan 50 mg some blood pressure is a still elevated. Plan: Check a BMP, stay on losartan 50 mg, restart carvedilol, watch BPs closely, call with readings. Hold off treatment for elevated uric acid, consider reintroduce allopurinol at some point (as recommended by nephrology) Scrotal mass: Likely cyst, sebaceous.  Recommend observation. RTC 3 months

## 2017-10-30 NOTE — Progress Notes (Signed)
Pre visit review using our clinic review tool, if applicable. No additional management support is needed unless otherwise documented below in the visit note. 

## 2017-10-31 NOTE — Assessment & Plan Note (Signed)
HTN, gout, elevated creatinine:  Since the last visit, uric acid was elevated, was referred to nephrology, they decided to switch Coreg to losartan. After that, he had pain at the left foot, he felt it was related to gout; symptoms are resolved and exam today is normal.. Currently taking losartan 50 mg some blood pressure is a still elevated. Plan: Check a BMP, stay on losartan 50 mg, restart carvedilol, watch BPs closely, call with readings. Hold off treatment for elevated uric acid, consider reintroduce allopurinol at some point (as recommended by nephrology) Scrotal mass: Likely cyst, sebaceous.  Recommend observation. RTC 3 months

## 2017-12-05 ENCOUNTER — Encounter: Payer: Self-pay | Admitting: Internal Medicine

## 2017-12-05 ENCOUNTER — Ambulatory Visit: Payer: BC Managed Care – PPO | Admitting: Internal Medicine

## 2017-12-05 ENCOUNTER — Ambulatory Visit (HOSPITAL_BASED_OUTPATIENT_CLINIC_OR_DEPARTMENT_OTHER)
Admission: RE | Admit: 2017-12-05 | Discharge: 2017-12-05 | Disposition: A | Discharge status: Home / Self Care | Payer: BC Managed Care – PPO | Source: Ambulatory Visit | Attending: Internal Medicine | Admitting: Internal Medicine

## 2017-12-05 VITALS — BP 118/70 | HR 68 | Temp 98.0°F | Resp 16 | Ht 66.0 in | Wt 223.2 lb

## 2017-12-05 DIAGNOSIS — R61 Generalized hyperhidrosis: Secondary | ICD-10-CM | POA: Insufficient documentation

## 2017-12-05 DIAGNOSIS — I10 Essential (primary) hypertension: Secondary | ICD-10-CM | POA: Diagnosis not present

## 2017-12-05 LAB — CBC WITH DIFFERENTIAL/PLATELET
BASOS PCT: 0.6 % (ref 0.0–3.0)
Basophils Absolute: 0 10*3/uL (ref 0.0–0.1)
EOS ABS: 0 10*3/uL (ref 0.0–0.7)
EOS PCT: 0.6 % (ref 0.0–5.0)
HEMATOCRIT: 42.1 % (ref 39.0–52.0)
HEMOGLOBIN: 13.8 g/dL (ref 13.0–17.0)
LYMPHS PCT: 44.8 % (ref 12.0–46.0)
Lymphs Abs: 1.5 10*3/uL (ref 0.7–4.0)
MCHC: 32.9 g/dL (ref 30.0–36.0)
MCV: 86.3 fl (ref 78.0–100.0)
MONO ABS: 0.3 10*3/uL (ref 0.1–1.0)
Monocytes Relative: 8.1 % (ref 3.0–12.0)
Neutro Abs: 1.6 10*3/uL (ref 1.4–7.7)
Neutrophils Relative %: 45.9 % (ref 43.0–77.0)
Platelets: 254 10*3/uL (ref 150.0–400.0)
RBC: 4.88 Mil/uL (ref 4.22–5.81)
RDW: 12.8 % (ref 11.5–15.5)
WBC: 3.4 10*3/uL — AB (ref 4.0–10.5)

## 2017-12-05 LAB — COMPREHENSIVE METABOLIC PANEL
ALT: 15 U/L (ref 0–53)
AST: 15 U/L (ref 0–37)
Albumin: 4.4 g/dL (ref 3.5–5.2)
Alkaline Phosphatase: 65 U/L (ref 39–117)
BILIRUBIN TOTAL: 0.5 mg/dL (ref 0.2–1.2)
BUN: 16 mg/dL (ref 6–23)
CALCIUM: 10.1 mg/dL (ref 8.4–10.5)
CO2: 28 meq/L (ref 19–32)
CREATININE: 1.49 mg/dL (ref 0.40–1.50)
Chloride: 104 mEq/L (ref 96–112)
GFR: 52.44 mL/min — ABNORMAL LOW (ref >60.00)
Glucose, Bld: 106 mg/dL — ABNORMAL HIGH (ref 70–99)
Potassium: 4.3 mEq/L (ref 3.5–5.1)
Sodium: 139 mEq/L (ref 135–145)
Total Protein: 7.3 g/dL (ref 6.0–8.3)

## 2017-12-05 LAB — URINALYSIS, ROUTINE W REFLEX MICROSCOPIC
BILIRUBIN URINE: NEGATIVE
HGB URINE DIPSTICK: NEGATIVE
KETONES UR: NEGATIVE
LEUKOCYTES UA: NEGATIVE
NITRITE: NEGATIVE
RBC / HPF: NONE SEEN (ref 0–?)
Specific Gravity, Urine: 1.015 (ref 1.000–1.030)
Total Protein, Urine: NEGATIVE
URINE GLUCOSE: NEGATIVE
Urobilinogen, UA: 0.2 (ref 0.0–1.0)
pH: 5.5 (ref 5.0–8.0)

## 2017-12-05 LAB — SEDIMENTATION RATE: SED RATE: 11 mm/h (ref 0–20)

## 2017-12-05 NOTE — Patient Instructions (Signed)
GO TO THE LAB : Get the blood work     GO TO THE FRONT DESK Schedule your next appointment for a  Check up in 2-3 weeks  Call anytime if you feel much worse, you have high fevers, a rash, severe headaches.

## 2017-12-05 NOTE — Progress Notes (Signed)
Subjective:    Patient ID: Justin Elliott, male    DOB: 21-Sep-1965, 52 y.o.   MRN: [id number]  DOS:  12/05/2017 Type of visit - description : acute Interval history: Patient is here because he is not feeling well.  Symptoms started approximately a week ago: tired, fatigue, malaise. He also developed some chills. For the last 2 or 3 days he is also feeling night sweats. Also c/o B anterior chest wall TTP symmetrically .  No SSCP, symptoms not worse with taking a deep breath simply by putting pressure on the chest.  See graphic. He is not taking carvedilol, good compliance with losartan, ambulatory BPs have been normal.   Review of Systems  Denies fevers per se. Some nausea but no abdominal pain.  No vomiting or diarrhea Was seen by dermatology with a rash on the forehead, prescribed ketoconazole and topical steroids.  No generalized rash. No headaches, no cough. Mild sinus congestion and sore throat described as "dry throat". Denies palpitations, no lower extremity edema or recent prolonged car or airplane trip. No dysuria, gross hematuria.  Past Medical History:  Diagnosis Date  . Elevated serum creatinine   . Gout   . HTN (hypertension) 2009    Past Surgical History:  Procedure Laterality Date  . FINGER SURGERY Right 1984    Social History   Socioeconomic History  . Marital status: Married    Spouse name: Not on file  . Number of children: 2  . Years of education: Not on file  . Highest education level: Not on file  Occupational History  . Occupation: [employer]  Social Needs  . Financial resource strain: Not on file  . Food insecurity:    Worry: Not on file    Inability: Not on file  . Transportation needs:    Medical: Not on file    Non-medical: Not on file  Tobacco Use  . Smoking status: Never Smoker  . Smokeless tobacco: Never Used  Substance and Sexual Activity  . Alcohol use: No    Alcohol/week: 0.0 standard drinks  . Drug  use: No  . Sexual activity: Not on file  Lifestyle  . Physical activity:    Days per week: Not on file    Minutes per session: Not on file  . Stress: Not on file  Relationships  . Social connections:    Talks on phone: Not on file    Gets together: Not on file    Attends religious service: Not on file    Active member of club or organization: Not on file    Attends meetings of clubs or organizations: Not on file    Relationship status: Not on file  . Intimate partner violence:    Fear of current or ex partner: Not on file    Emotionally abused: Not on file    Physically abused: Not on file    Forced sexual activity: Not on file  Other Topics Concern  . Not on file  Social History Narrative   Household-- wife, 2 children     D 2003 (lives at home)    [employer] (college)   PHD 2017      Allergies as of 12/05/2017   No Known Allergies     Medication List        Accurate as of 12/05/17 11:01 AM. Always use your most recent med list.          carvedilol 25 MG tablet Commonly known  as:  COREG Take 1 tablet (25 mg total) by mouth 2 (two) times daily with a meal.   colchicine 0.6 MG tablet Take 1 tablet (0.6 mg total) by mouth 2 (two) times daily as needed (gout).   desonide 0.05 % cream Commonly known as:  DESOWEN As directed   ketoconazole 2 % cream Commonly known as:  NIZORAL As directed   losartan 25 MG tablet Commonly known as:  COZAAR Take 2 tablets (50 mg total) by mouth daily.          Objective:   Physical Exam  Abdominal:     BP 118/70 (BP Location: Left Arm, Patient Position: Sitting, Cuff Size: Small)   Pulse 68   Temp 98 F (36.7 C) (Oral)   Resp 16   Ht 5\' 6"  (1.676 m)   Wt 223 lb 4 oz (101.3 kg)   SpO2 90%   BMI 36.03 kg/m  General:   Well developed, looks somewhat tired and frustrated by not feeling well but nontoxic-appearing. HEENT:  Normocephalic . Face symmetric, atraumatic Lungs:  CTA B Normal respiratory effort, no  intercostal retractions, no accessory muscle use. Heart: RRR,  no murmur.  no pretibial edema bilaterally  Abdomen:  Not distended, soft, non-tender. No rebound or rigidity.   Skin: Not pale. Not jaundice Neurologic:  alert & oriented X3.  Speech normal, gait appropriate for age and unassisted Psych--  Cognition and judgment appear intact.  Cooperative with normal attention span and concentration.  Behavior appropriate. No anxious or depressed appearing.     Assessment & Plan:     Assessment: HTN Gout: DX 08-2016 Slightly increased creatinine:  Renal US 2017 wnl, microalbumin (-). Likely d/t large muscle mass. Saw nephrology 11/2016, they rec good BP control, treat elevated cholesterol if needed, weight loss, avoid NSAIDs, keep hydrated, consider treatment w/ uric acid lowering meds  PLAN Malaise, night sweats: Etiology unclear, viral infection?  others?.  The patient does not look toxic.  He has had a rash on the forehead but no generalized rash. Plan: UA, urine culture, chest x-ray, CBC, CMP, sed rate. If results all normal and the patient continued to feel unwell, will consider stop losartan which is one of the new  medication he is taking.  He will have to go back on carvedilol. HTN: Currently on losartan only, not taking carvedilol.  BPs look good at home.see above RTC to 3 weeks

## 2017-12-05 NOTE — Progress Notes (Signed)
Pre visit review using our clinic review tool, if applicable. No additional management support is needed unless otherwise documented below in the visit note. 

## 2017-12-06 ENCOUNTER — Other Ambulatory Visit: Payer: Self-pay | Admitting: Internal Medicine

## 2017-12-06 LAB — URINE CULTURE
MICRO NUMBER: 91299892
Result:: NO GROWTH
SPECIMEN QUALITY:: ADEQUATE

## 2017-12-06 NOTE — Assessment & Plan Note (Signed)
Malaise, night sweats: Etiology unclear, viral infection?  others?.  The patient does not look toxic.  He has had a rash on the forehead but no generalized rash. Plan: UA, urine culture, chest x-ray, CBC, CMP, sed rate. If results all normal and the patient continued to feel unwell, will consider stop losartan which is one of the new  medication he is taking.  He will have to go back on carvedilol. HTN: Currently on losartan only, not taking carvedilol.  BPs look good at home.see above RTC to 3 weeks

## 2017-12-16 ENCOUNTER — Other Ambulatory Visit: Payer: Self-pay | Admitting: Internal Medicine

## 2018-01-02 ENCOUNTER — Encounter: Payer: Self-pay | Admitting: Internal Medicine

## 2018-01-02 ENCOUNTER — Ambulatory Visit: Payer: BC Managed Care – PPO | Admitting: Internal Medicine

## 2018-01-02 VITALS — BP 162/84 | HR 68 | Temp 97.5°F | Resp 18 | Ht 66.0 in | Wt 221.2 lb

## 2018-01-02 DIAGNOSIS — I10 Essential (primary) hypertension: Secondary | ICD-10-CM

## 2018-01-02 DIAGNOSIS — Z23 Encounter for immunization: Secondary | ICD-10-CM

## 2018-01-02 DIAGNOSIS — M79672 Pain in left foot: Secondary | ICD-10-CM | POA: Diagnosis not present

## 2018-01-02 DIAGNOSIS — Z111 Encounter for screening for respiratory tuberculosis: Secondary | ICD-10-CM | POA: Diagnosis not present

## 2018-01-02 MED ORDER — CARVEDILOL 25 MG PO TABS: 25.0000 mg | ORAL_TABLET | Freq: Two times a day (BID) | ORAL | 6 refills | Status: DC

## 2018-01-02 NOTE — Progress Notes (Signed)
Subjective:    Patient ID: Justin Elliott, male    DOB: 1966/01/31, 52 y.o.   MRN: [id number]  DOS:  01/02/2018 Type of visit - description : f/u Since the last office visit, he stopped taking losartan about 10 days ago, he was feeling weak, "like I have the flu", some sore throat.  No lip or tongue swelling. Also see last OV, was seen with night sweats: Symptoms quickly results Needs TB Gold for work Reports pain at the site of the left foot mostly with food flexion.  No injury, redness, warmness.   Review of Systems Denies chest pain or difficulty breathing.  Past Medical History:  Diagnosis Date  . Elevated serum creatinine   . Gout   . HTN (hypertension) 2009    Past Surgical History:  Procedure Laterality Date  . FINGER SURGERY Right 1984    Social History   Socioeconomic History  . Marital status: Married    Spouse name: Not on file  . Number of children: 2  . Years of education: Not on file  . Highest education level: Not on file  Occupational History  . Occupation: [employer]  Social Needs  . Financial resource strain: Not on file  . Food insecurity:    Worry: Not on file    Inability: Not on file  . Transportation needs:    Medical: Not on file    Non-medical: Not on file  Tobacco Use  . Smoking status: Never Smoker  . Smokeless tobacco: Never Used  Substance and Sexual Activity  . Alcohol use: No    Alcohol/week: 0.0 standard drinks  . Drug use: No  . Sexual activity: Not on file  Lifestyle  . Physical activity:    Days per week: Not on file    Minutes per session: Not on file  . Stress: Not on file  Relationships  . Social connections:    Talks on phone: Not on file    Gets together: Not on file    Attends religious service: Not on file    Active member of club or organization: Not on file    Attends meetings of clubs or organizations: Not on file    Relationship status: Not on file  . Intimate partner violence:   Fear of current or ex partner: Not on file    Emotionally abused: Not on file    Physically abused: Not on file    Forced sexual activity: Not on file  Other Topics Concern  . Not on file  Social History Narrative   Household-- wife, 2 children     D 2003 (lives at home)    [employer] (college)   PHD 2017      Allergies as of 01/02/2018      Reactions   Losartan    Sore throat, no lip-tongue swelling      Medication List        Accurate as of 01/02/18 11:59 PM. Always use your most recent med list.          carvedilol 25 MG tablet Commonly known as:  COREG Take 1 tablet (25 mg total) by mouth 2 (two) times daily with a meal.   colchicine 0.6 MG tablet Take 1 tablet (0.6 mg total) by mouth 2 (two) times daily as needed (gout).   desonide 0.05 % cream Commonly known as:  DESOWEN As directed   ketoconazole 2 % cream Commonly known as:  NIZORAL As directed  Objective:   Physical Exam  Musculoskeletal:       Feet:   BP (!) 162/84 (BP Location: Left Arm, Patient Position: Sitting, Cuff Size: Normal)   Pulse 68   Temp (!) 97.5 F (36.4 C) (Oral)   Resp 18   Ht 5\' 6"  (1.676 m)   Wt 221 lb 4 oz (100.4 kg)   SpO2 96%   BMI 35.71 kg/m  General:   Well developed, NAD, BMI noted. HEENT:  Normocephalic . Face symmetric, atraumatic Lungs:  CTA B Normal respiratory effort, no intercostal retractions, no accessory muscle use. Heart: RRR,  no murmur.  Skin: Not pale. Not jaundice Neurologic:  alert & oriented X3.  Speech normal, gait appropriate for age and unassisted Psych--  Cognition and judgment appear intact.  Cooperative with normal attention span and concentration.  Behavior appropriate. No anxious or depressed appearing.      Assessment & Plan:   Assessment: HTN Gout: DX 08-2016 Slightly increased creatinine:  Renal US 2017 wnl, microalbumin (-). Likely d/t large muscle mass. Saw nephrology 11/2016, they rec good BP control, treat  elevated cholesterol if needed, weight loss, avoid NSAIDs, keep hydrated, consider treatment w/ uric acid lowering meds  PLAN  Malaise, night sweats: See last visit, work-up negative, symptoms resolved. HTN: Since the last visit, he was intolerant to losartan.  Stop it several days ago.  BP today 162/84.  We agreed to go back on carvedilol which previously worked.  Recommend to monitor BPs, see AVS. Foot pain: Likely tendinitis, doubt gout.  Recommend ice, leg elevation and Tylenol Patient is moving out of state by the end of the year, will get a TB gold.  Encouraged to get a MyChart account to get his records. Follow-up: Needs to see a MD in the next few months.  Will call in few days if BP is not well controlled.

## 2018-01-02 NOTE — Progress Notes (Signed)
Pre visit review using our clinic review tool, if applicable. No additional management support is needed unless otherwise documented below in the visit note. 

## 2018-01-02 NOTE — Patient Instructions (Signed)
GO TO THE LAB : Get the blood work     Go back on carvedilol 25 mg 1 tablet twice a day.  If you feel is too strong, okay to take half tablet twice a day for the first few days.   Check the  blood pressure 2 or 3 times a  week  Be sure your blood pressure is between 110/65 and  135/85. If it is consistently higher or lower, let me know  ICE the left foot twice a day Tylenol  500 mg OTC 2 tabs a day every 8 hours as needed for pain  Call if no better in the next 10 days

## 2018-01-04 NOTE — Assessment & Plan Note (Signed)
Malaise, night sweats: See last visit, work-up negative, symptoms resolved. HTN: Since the last visit, he was intolerant to losartan.  Stop it several days ago.  BP today 162/84.  We agreed to go back on carvedilol which previously worked.  Recommend to monitor BPs, see AVS. Foot pain: Likely tendinitis, doubt gout.  Recommend ice, leg elevation and Tylenol Patient is moving out of state by the end of the year, will get a TB gold.  Encouraged to get a MyChart account to get his records. Follow-up: Needs to see a MD in the next few months.  Will call in few days if BP is not well controlled.

## 2018-01-05 LAB — QUANTIFERON-TB GOLD PLUS
NIL: 0.02 [IU]/mL
QuantiFERON-TB Gold Plus: NEGATIVE
TB1-NIL: 0.01 [IU]/mL
TB2-NIL: 0.05 [IU]/mL

## 2018-01-08 ENCOUNTER — Encounter: Payer: Self-pay | Admitting: Internal Medicine

## 2018-01-10 ENCOUNTER — Ambulatory Visit: Payer: BC Managed Care – PPO | Admitting: Internal Medicine

## 2018-01-11 ENCOUNTER — Ambulatory Visit: Payer: Self-pay | Admitting: *Deleted

## 2018-01-11 ENCOUNTER — Encounter: Payer: Self-pay | Admitting: Internal Medicine

## 2018-01-11 NOTE — Telephone Encounter (Signed)
The pt called stating that his blood pressure has been 160-170's/105-110's; per Dr Ethel Rana my chart response the pt should schedule an appointment; nurse triage also initiated and recommendations made per protocol; the pt is normally seen by Dr Larose Kells, [facility], but this provider has no availability; spoke with [facility] and she states that since Dr Larose Kells is not in the office the pt should go to ED; notified pt and he verbalized understanding; will also route to office for notification.    Reason for Disposition . Systolic BP  >= 496 OR Diastolic >= 759  Answer Assessment - Initial Assessment Questions 1. BLOOD PRESSURE: "What is the blood pressure?" "Did you take at least two measurements 5 minutes apart?"      2. ONSET: "When did you take your blood pressure?"     01/11/18 at 1230 3. HOW: "How did you obtain the blood pressure?" (e.g., visiting nurse, automatic home BP monitor)     Automatic cuff left upper arm 4. HISTORY: "Do you have a history of high blood pressure?"     yes 5. MEDICATIONS: "Are you taking any medications for blood pressure?" "Have you missed any doses recently?"     no 6. OTHER SYMPTOMS: "Do you have any symptoms?" (e.g., headache, chest pain, blurred vision, difficulty breathing, weakness)     headache 7. PREGNANCY: "Is there any chance you are pregnant?" "When was your last menstrual period?"     n/a  Protocols used: HIGH BLOOD PRESSURE-A-AH

## 2018-01-11 NOTE — Telephone Encounter (Signed)
Noted  

## 2018-01-17 ENCOUNTER — Ambulatory Visit (HOSPITAL_BASED_OUTPATIENT_CLINIC_OR_DEPARTMENT_OTHER)
Admission: RE | Admit: 2018-01-17 | Discharge: 2018-01-17 | Disposition: A | Discharge status: Home / Self Care | Payer: BC Managed Care – PPO | Source: Ambulatory Visit | Attending: Internal Medicine | Admitting: Internal Medicine

## 2018-01-17 ENCOUNTER — Ambulatory Visit: Payer: BC Managed Care – PPO | Admitting: Internal Medicine

## 2018-01-17 ENCOUNTER — Encounter: Payer: Self-pay | Admitting: Internal Medicine

## 2018-01-17 VITALS — BP 150/105 | HR 57 | Temp 98.1°F | Resp 16 | Ht 66.0 in | Wt 221.5 lb

## 2018-01-17 DIAGNOSIS — G4452 New daily persistent headache (NDPH): Secondary | ICD-10-CM | POA: Diagnosis not present

## 2018-01-17 DIAGNOSIS — I10 Essential (primary) hypertension: Secondary | ICD-10-CM

## 2018-01-17 MED ORDER — CYCLOBENZAPRINE HCL 10 MG PO TABS: 10.0000 mg | ORAL_TABLET | Freq: Every evening | ORAL | 0 refills | Status: DC | PRN

## 2018-01-17 MED ORDER — AMLODIPINE BESYLATE 10 MG PO TABS: 10.0000 mg | ORAL_TABLET | Freq: Every day | ORAL | 2 refills | Status: DC

## 2018-01-17 NOTE — Progress Notes (Signed)
Pre visit review using our clinic review tool, if applicable. No additional management support is needed unless otherwise documented below in the visit note. 

## 2018-01-17 NOTE — Patient Instructions (Signed)
Get your head CAT scan this morning at the first floor  Continue carvedilol  Start a new medication called amlodipine, 1 tablet daily  Check the  blood pressure 2 or 3 times a week Be sure your blood pressure is between 110/65 and  145/85.  if it is consistently higher or lower, let me know   Need to see MD in 4 weeks  ==============  For the headache: Once the CT is negative, start taking Tylenol  500 mg OTC 2 tabs a day every 8 hours as needed for pain  Flexeril at night  ER if severe headache

## 2018-01-17 NOTE — Progress Notes (Signed)
Subjective:    Patient ID: Justin Elliott, male    DOB: Nov 19, 1965, 52 y.o.   MRN: [id number]  DOS:  01/17/2018 Type of visit - description : acute Patient is here with 2 concerns BP continues to be elevated despite taking carvedilol.  In the ambulatory setting is usually 160/100, 160/110.  Also, a week ago developed right-sided headache, described as a pain that runs from behind the right eye to the neck. Worse with certain neck movements?. Has taken Excedrin for headaches a couple of times without much success, does not taking NSAIDs regularly.  Review of Systems  Denies fever chills No sinus pain or congestion.  No cough.  Occasional sore throat. No nausea, vomiting.  No photophobia. No eye tearing.  Past Medical History:  Diagnosis Date  . Elevated serum creatinine   . Gout   . HTN (hypertension) 2009    Past Surgical History:  Procedure Laterality Date  . FINGER SURGERY Right 1984    Social History   Socioeconomic History  . Marital status: Married    Spouse name: Not on file  . Number of children: 2  . Years of education: Not on file  . Highest education level: Not on file  Occupational History  . Occupation: [employer]  Social Needs  . Financial resource strain: Not on file  . Food insecurity:    Worry: Not on file    Inability: Not on file  . Transportation needs:    Medical: Not on file    Non-medical: Not on file  Tobacco Use  . Smoking status: Never Smoker  . Smokeless tobacco: Never Used  Substance and Sexual Activity  . Alcohol use: No    Alcohol/week: 0.0 standard drinks  . Drug use: No  . Sexual activity: Not on file  Lifestyle  . Physical activity:    Days per week: Not on file    Minutes per session: Not on file  . Stress: Not on file  Relationships  . Social connections:    Talks on phone: Not on file    Gets together: Not on file    Attends religious service: Not on file    Active member of club or  organization: Not on file    Attends meetings of clubs or organizations: Not on file    Relationship status: Not on file  . Intimate partner violence:    Fear of current or ex partner: Not on file    Emotionally abused: Not on file    Physically abused: Not on file    Forced sexual activity: Not on file  Other Topics Concern  . Not on file  Social History Narrative   Household-- wife, 2 children     D 2003 (lives at home)    [employer] (college)   PHD 2017      Allergies as of 01/17/2018      Reactions   Losartan    Sore throat, no lip-tongue swelling      Medication List       Accurate as of January 17, 2018 11:59 PM. Always use your most recent med list.        amLODipine 10 MG tablet Commonly known as:  NORVASC Take 1 tablet (10 mg total) by mouth daily.   carvedilol 25 MG tablet Commonly known as:  COREG Take 1 tablet (25 mg total) by mouth 2 (two) times daily with a meal.   colchicine 0.6 MG tablet Take 1  tablet (0.6 mg total) by mouth 2 (two) times daily as needed (gout).   cyclobenzaprine 10 MG tablet Commonly known as:  FLEXERIL Take 1 tablet (10 mg total) by mouth at bedtime as needed for muscle spasms.   desonide 0.05 % cream Commonly known as:  DESOWEN As directed   ketoconazole 2 % cream Commonly known as:  NIZORAL As directed           Objective:   Physical Exam BP (!) 150/105   Pulse (!) 57   Temp 98.1 F (36.7 C) (Oral)   Resp 16   Ht 5\' 6"  (1.676 m)   Wt 221 lb 8 oz (100.5 kg)   SpO2 98%   BMI 35.75 kg/m  General:   Well developed, NAD, BMI noted. HEENT:  Normocephalic . Face symmetric, atraumatic . EOMI, pupils equal and reactive.  No photophobia noted. Neck: Full range of motion, no TTP at the cervical spine  Lungs:  CTA B Normal respiratory effort, no intercostal retractions, no accessory muscle use. Heart: RRR,  no murmur.  No pretibial edema bilaterally  Skin: Not pale. Not jaundice Neurologic:  alert & oriented X3.    Speech normal, gait appropriate for age and unassisted.  Motor symmetric. Psych--  Cognition and judgment appear intact.  Cooperative with normal attention span and concentration.  Behavior appropriate. No anxious or depressed appearing.      Assessment & Plan:     Assessment: HTN Gout: DX 08-2016 Slightly increased creatinine:  Renal US 2017 wnl, microalbumin (-). Likely d/t large muscle mass. Saw nephrology 11/2016, they rec good BP control, treat elevated cholesterol if needed, weight loss, avoid NSAIDs, keep hydrated, consider treatment w/ uric acid lowering meds  PLAN   HTN: Started carvedilol 2 weeks ago, good compliance, ambulatory BPs remain elevated.Last BMP satisfactory few weeks ago.   Check manual BP, right arm: 150/105. Continue carvedilol & healthy lifestyle, add amlodipine 10 mg.  He is aware it may take few days to few weeks to start working.  Also aware that dose might need to be reduced depending on his response. Needs to be checked by MD in 4 weeks, he will be in [place], okay to go to a urgent care to be checked. Headache: New onset, daily headache, could be cervicogenic, migraine new onset, versus others.  Headache happening in the context of not controlled HTN.  We will get a CT head.  If negative start Tylenol, Flexeril.  See AVS.   Stress: I asked him about the stress and he admits to that, he is moving out of the state, his mother has been sick.  Counseled.

## 2018-01-18 NOTE — Assessment & Plan Note (Signed)
  HTN: Started carvedilol 2 weeks ago, good compliance, ambulatory BPs remain elevated.Last BMP satisfactory few weeks ago.   Check manual BP, right arm: 150/105. Continue carvedilol & healthy lifestyle, add amlodipine 10 mg.  He is aware it may take few days to few weeks to start working.  Also aware that dose might need to be reduced depending on his response. Needs to be checked by MD in 4 weeks, he will be in [place], okay to go to a urgent care to be checked. Headache: New onset, daily headache, could be cervicogenic, migraine new onset, versus others.  Headache happening in the context of not controlled HTN.  We will get a CT head.  If negative start Tylenol, Flexeril.  See AVS.   Stress: I asked him about the stress and he admits to that, he is moving out of the state, his mother has been sick.  Counseled.

## 2018-01-19 ENCOUNTER — Ambulatory Visit (INDEPENDENT_AMBULATORY_CARE_PROVIDER_SITE_OTHER): Payer: BC Managed Care – PPO

## 2018-01-19 ENCOUNTER — Ambulatory Visit: Payer: BC Managed Care – PPO | Admitting: Podiatry

## 2018-01-19 VITALS — BP 173/106 | HR 62 | Resp 16

## 2018-01-19 DIAGNOSIS — M7750 Other enthesopathy of unspecified foot: Secondary | ICD-10-CM

## 2018-01-19 DIAGNOSIS — M7752 Other enthesopathy of left foot: Secondary | ICD-10-CM | POA: Diagnosis not present

## 2018-01-19 DIAGNOSIS — M779 Enthesopathy, unspecified: Secondary | ICD-10-CM

## 2018-01-19 MED ORDER — METHYLPREDNISOLONE 4 MG PO TBPK: ORAL_TABLET | ORAL | 0 refills | Status: DC

## 2018-01-19 MED ORDER — MELOXICAM 15 MG PO TABS: 15.0000 mg | ORAL_TABLET | Freq: Every day | ORAL | 0 refills | Status: DC

## 2018-01-19 NOTE — Patient Instructions (Signed)
Peroneal Tendinopathy Rehab  Ask your health care provider which exercises are safe for you. Do exercises exactly as told by your health care provider and adjust them as directed. It is normal to feel mild stretching, pulling, tightness, or discomfort as you do these exercises, but you should stop right away if you feel sudden pain or your pain gets worse.Do not begin these exercises until told by your health care provider.  Stretching and range of motion exercises  These exercises warm up your muscles and joints and improve the movement and flexibility of your ankle. These exercises also help to relieve pain and stiffness.  Exercise A: Gastroc and soleus, standing  1. Stand on the edge of a step on the balls of your feet. The ball of your foot is on the walking surface, right under your toes.  2. Hold onto the railing for balance.  3. Slowly lift your left / right foot, allowing your body weight to press your left / right heel down over the edge of the step. You should feel a stretch in your left / right calf.  4. Hold this position for __________ seconds.  Repeat __________ times with your left / right knee straight and __________ times with your left / right knee bent. Complete this stretch __________ times per day.  Strengthening exercises  These exercises improve the strength and endurance of your foot and ankle. Endurance is the ability to use your muscles for a long time, even after they get tired.  Exercise B: Dorsiflexors    1. Secure a rubber exercise band or tube to an object, like a table leg, that will not move if it is pulled on.  2. Secure the other end of the band around your left / right foot.  3. Sit on the floor, facing the object with your left / right foot extended. The band or tube should be slightly tense when your foot is relaxed.  4. Slowly flex your left / right ankle and toes to bring your foot toward you.  5. Hold this position for __________ seconds.  6. Slowly return your foot to the  starting position.  Repeat __________ times. Complete this exercise __________ times per day.  Exercise C: Evertors  1. Sit on the floor with your legs straight out in front of you.  2. Loop a rubber exercise or band or tube around the ball of your left / right foot. The ball of your foot is on the walking surface, right under your toes.  3. Hold the ends of the band in your hands, or secure the band to a stable object.  4. Slowly push your foot outward, away from your other leg.  5. Hold this position for __________ seconds.  6. Slowly return your foot to the starting position.  Repeat __________ times. Complete this exercise __________ times per day.  Exercise D: Standing heel raise (  plantar flexion)  1. Stand with your feet shoulder-width apart with the balls of your feet on a step. The ball of your foot is on the walking surface, right under your toes.  2. Keep your weight spread evenly over the width of your feet while you rise up on your toes. Use a wall or railing to steady yourself, but try not to use it for support.  3. If this exercise is too easy, try these options:  ? Shift your weight toward your left / right leg until you feel challenged.  ? If told by   your health care provider, stand on your left / right leg only.  4. Hold this position for __________ seconds.  Repeat __________ times. Complete this exercise __________ times per day.  Exercise E: Single leg stand  1. Without shoes, stand near a railing or in a doorway. You may hold onto the railing or door frame as needed.  2. Stand on your left / right foot. Keep your big toe down on the floor and try to keep your arch lifted.  ? Do not roll to the outside of your foot.  ? If this exercise is too easy, you can try it with your eyes closed or while standing on a pillow.  3. Hold this position for __________ seconds.  Repeat __________ times. Complete this exercise __________ times per day.  This information is not intended to replace advice given to  you by your health care provider. Make sure you discuss any questions you have with your health care provider.  Document Released: 01/24/2005 Document Revised: 10/01/2015 Document Reviewed: 12/13/2014  Elsevier Interactive Patient Education  2018 [employer]

## 2018-01-24 NOTE — Progress Notes (Signed)
Subjective:   Patient ID: Justin Elliott, male   DOB: 52 y.o.   MRN: [id number]   HPI 51 year old male presents the office today for concerns of pain, aching sensation to the left ankle, points the lateral aspect.  He states he was an active runner has had a decrease because the tenderness.  He has noticed some dark discoloration of the also aspect of the ankle.  Has been trying to rest and ice the area.  Denies any recent injury or trauma.  No other treatment.  No other concerns.   Review of Systems  All other systems reviewed and are negative.  Past Medical History:  Diagnosis Date  . Elevated serum creatinine   . Gout   . HTN (hypertension) 2009    Past Surgical History:  Procedure Laterality Date  . FINGER SURGERY Right 1984     Current Outpatient Medications:  .  amLODipine (NORVASC) 10 MG tablet, Take 1 tablet (10 mg total) by mouth daily., Disp: 30 tablet, Rfl: 2 .  carvedilol (COREG) 25 MG tablet, Take 1 tablet (25 mg total) by mouth 2 (two) times daily with a meal., Disp: 60 tablet, Rfl: 6 .  colchicine 0.6 MG tablet, Take 1 tablet (0.6 mg total) by mouth 2 (two) times daily as needed (gout)., Disp: 60 tablet, Rfl: 1 .  cyclobenzaprine (FLEXERIL) 10 MG tablet, Take 1 tablet (10 mg total) by mouth at bedtime as needed for muscle spasms., Disp: 21 tablet, Rfl: 0 .  desonide (DESOWEN) 0.05 % cream, As directed, Disp: , Rfl: 2 .  ketoconazole (NIZORAL) 2 % cream, As directed, Disp: , Rfl: 2 .  meloxicam (MOBIC) 15 MG tablet, Take 1 tablet (15 mg total) by mouth daily., Disp: 30 tablet, Rfl: 0 .  methylPREDNISolone (MEDROL DOSEPAK) 4 MG TBPK tablet, Take as directed, Disp: 21 tablet, Rfl: 0  Allergies  Allergen Reactions  . Losartan     Sore throat, no lip-tongue swelling        Objective:  Physical Exam  General: AAO x3, NAD  Dermatological: Skin is warm, dry and supple bilateral. Nails x 10 are well manicured; remaining integument appears unremarkable at this time.  There are no open sores, no preulcerative lesions, no rash or signs of infection present.  Vascular: Dorsalis Pedis artery and Posterior Tibial artery pedal pulses are 2/4 bilateral with immedate capillary fill time. Pedal hair growth present. No varicosities and no lower extremity edema present bilateral. There is no pain with calf compression, swelling, warmth, erythema.   Neruologic: Grossly intact via light touch bilateral.Protective threshold with Semmes Wienstein monofilament intact to all pedal sites bilateral.   Musculoskeletal: There is some mild edema to the lateral aspect of the patient's left ankle.  There is no area pinpoint bony tenderness there is no pain to vibratory sensation.  There is no pain with ankle or subtalar joint range of motion.  The swelling appears to be along the course of the peroneal tendon however overall the tendon appears to be intact and no significant discomfort today.  There is tendon intact but any discomfort.  Minimal discomfort and swelling on the sinus tarsi as well.  Muscular strength 5/5 in all groups tested bilateral.  Gait: Unassisted, Nonantalgic.       Assessment:   Tendinitis, capsulitis left side    Plan:  -Treatment options discussed including all alternatives, risks, and complications -Etiology of symptoms were discussed -X-rays were obtained and reviewed with the patient.  No definitive evidence of  acute fracture or stress fracture. -Trilock ankle brace dispensed. -We will start with a Medrol Dosepak.  Once this is complete we will do meloxicam to help with the swelling and pain.  Discussed how to take them together.  Continue ice and elevate as well.  Hold off on running for now.  Return in about 3 weeks (around 02/09/2018).  Trula Slade DPM

## 2018-02-09 ENCOUNTER — Ambulatory Visit: Payer: BC Managed Care – PPO | Admitting: Podiatry

## 2018-04-06 ENCOUNTER — Encounter: Payer: Self-pay | Admitting: Internal Medicine

## 2018-04-06 MED ORDER — COLCHICINE 0.6 MG PO TABS: 0.6000 mg | ORAL_TABLET | Freq: Two times a day (BID) | ORAL | 1 refills | Status: DC | PRN

## 2018-05-23 ENCOUNTER — Telehealth: Payer: Self-pay

## 2018-05-23 NOTE — Telephone Encounter (Signed)
Tried calling Pt- no answer, voicemail box is full. Pt due for visit- virtual visit?

## 2018-06-27 ENCOUNTER — Encounter: Payer: Self-pay | Admitting: Internal Medicine

## 2018-06-27 ENCOUNTER — Other Ambulatory Visit: Payer: Self-pay

## 2018-06-27 ENCOUNTER — Ambulatory Visit (INDEPENDENT_AMBULATORY_CARE_PROVIDER_SITE_OTHER): Payer: BC Managed Care – PPO | Admitting: Internal Medicine

## 2018-06-27 DIAGNOSIS — J302 Other seasonal allergic rhinitis: Secondary | ICD-10-CM

## 2018-06-27 DIAGNOSIS — I10 Essential (primary) hypertension: Secondary | ICD-10-CM | POA: Diagnosis not present

## 2018-06-27 MED ORDER — CARVEDILOL 25 MG PO TABS: 25.0000 mg | ORAL_TABLET | Freq: Two times a day (BID) | ORAL | 6 refills | Status: DC

## 2018-06-27 MED ORDER — AMLODIPINE BESYLATE 10 MG PO TABS: 10.0000 mg | ORAL_TABLET | Freq: Every day | ORAL | 6 refills | Status: DC

## 2018-06-27 NOTE — Progress Notes (Signed)
Subjective:    Patient ID: Justin Elliott, male    DOB: [birth year], 53 y.o.   MRN: [id number]  DOS:  06/27/2018 Type of visit - description: Virtual Visit via Video Note  I connected with@ on 06/27/18 at  3:40 PM EDT by a video enabled telemedicine application and verified that I am speaking with the correct person using two identifiers.   THIS ENCOUNTER IS A VIRTUAL VISIT DUE TO COVID-19 - PATIENT WAS NOT SEEN IN THE OFFICE. PATIENT HAS CONSENTED TO VIRTUAL VISIT / TELEMEDICINE VISIT   Location of patient: home  Location of provider: office  I discussed the limitations of evaluation and management by telemedicine and the availability of in person appointments. The patient expressed understanding and agreed to proceed.  History of Present Illness: Has several concerns  His throat feels dry for 1 week, and he is unable to "clear it". He also has a sore throat for 1 day. For the last couple of weeks he is having watery eyes, ear and sinus pressure.  HTN: Ambulatory BPs are checked infrequently and sometimes are still elevated 140/90.  On further questioning, he is not taking meds regulalrly.  COVID-19: Taking good precautions, he works at home, he wears a mask when needed, does not know of any known call to contact.     Review of Systems Denies fever chills No itchy throat No chest pain no difficulty breathing No GERD symptoms Occasional cough which is dry.  Past Medical History:  Diagnosis Date  . Elevated serum creatinine   . Gout   . HTN (hypertension) 2009    Past Surgical History:  Procedure Laterality Date  . FINGER SURGERY Right 1984    Social History   Socioeconomic History  . Marital status: Married    Spouse name: Not on file  . Number of children: 2  . Years of education: Not on file  . Highest education level: Not on file  Occupational History  . Occupation: [employer]  Social Needs  . Financial resource strain: Not on file   . Food insecurity:    Worry: Not on file    Inability: Not on file  . Transportation needs:    Medical: Not on file    Non-medical: Not on file  Tobacco Use  . Smoking status: Never Smoker  . Smokeless tobacco: Never Used  Substance and Sexual Activity  . Alcohol use: No    Alcohol/week: 0.0 standard drinks  . Drug use: No  . Sexual activity: Not on file  Lifestyle  . Physical activity:    Days per week: Not on file    Minutes per session: Not on file  . Stress: Not on file  Relationships  . Social connections:    Talks on phone: Not on file    Gets together: Not on file    Attends religious service: Not on file    Active member of club or organization: Not on file    Attends meetings of clubs or organizations: Not on file    Relationship status: Not on file  . Intimate partner violence:    Fear of current or ex partner: Not on file    Emotionally abused: Not on file    Physically abused: Not on file    Forced sexual activity: Not on file  Other Topics Concern  . Not on file  Social History Narrative   Household-- wife, 2 children     D 2003 (lives at home)  [employer] (college)   PHD 2017      Allergies as of 06/27/2018      Reactions   Losartan    Sore throat, no lip-tongue swelling      Medication List       Accurate as of Jun 27, 2018  3:44 PM. If you have any questions, ask your nurse or doctor.        amLODipine 10 MG tablet Commonly known as:  NORVASC Take 1 tablet (10 mg total) by mouth daily.   carvedilol 25 MG tablet Commonly known as:  COREG Take 1 tablet (25 mg total) by mouth 2 (two) times daily with a meal.   colchicine 0.6 MG tablet Take 1 tablet (0.6 mg total) by mouth 2 (two) times daily as needed (gout).   cyclobenzaprine 10 MG tablet Commonly known as:  FLEXERIL Take 1 tablet (10 mg total) by mouth at bedtime as needed for muscle spasms.   desonide 0.05 % cream Commonly known as:  DESOWEN As directed   ketoconazole 2 % cream  Commonly known as:  NIZORAL As directed   meloxicam 15 MG tablet Commonly known as:  MOBIC Take 1 tablet (15 mg total) by mouth daily.   methylPREDNISolone 4 MG Tbpk tablet Commonly known as:  MEDROL DOSEPAK Take as directed           Objective:   Physical Exam There were no vitals taken for this visit. This is a virtual video visit.  Patient is alert oriented x3, no apparent distress    Assessment      Assessment: HTN Gout: DX 08-2016 Slightly increased creatinine:  Renal US 2017 wnl, microalbumin (-). Likely d/t large muscle mass. Saw nephrology 11/2016, they rec good BP control, treat elevated cholesterol if needed, weight loss, avoid NSAIDs, keep hydrated, consider treatment w/ uric acid lowering meds  PLAN   Allergies: Complaining of ear and sinus pressure as well as a "dry throat".  Although etiology is not completely clear but probably is allergy related, he does not suspect this is COVID related, I agree with him. Plan: Claritin, Mucinex, Flonase.  See message HTN: Admits that needs to do better with diet and exercise, admits he is not compliant with amlodipine and has not been taking carvedilol.  Ambulatory BPs is still in the high side, 140/90. Plan: Refill amlodipine and carvedilol, watch diet carefully, increase physical activity. COVID-19: Continue with precautions, seek immediate attention if he has fever, chest pain, or other red flag symptoms. Due for CPX in June, will call and schedule

## 2018-06-28 ENCOUNTER — Telehealth: Payer: Self-pay | Admitting: Internal Medicine

## 2018-06-28 NOTE — Telephone Encounter (Signed)
-----   Message from Colon Branch, MD sent at 06/27/2018  4:14 PM EDT ----- Regarding: Needs a CPX on June, please set up

## 2018-06-28 NOTE — Assessment & Plan Note (Signed)
Allergies: Complaining of ear and sinus pressure as well as a "dry throat".  Although etiology is not completely clear but probably is allergy related, he does not suspect this is COVID related, I agree with him. Plan: Claritin, Mucinex, Flonase.  See message HTN: Admits that needs to do better with diet and exercise, admits he is not compliant with amlodipine and has not been taking carvedilol.  Ambulatory BPs is still in the high side, 140/90. Plan: Refill amlodipine and carvedilol, watch diet carefully, increase physical activity. COVID-19: Continue with precautions, seek immediate attention if he has fever, chest pain, or other red flag symptoms. Due for CPX in June, will call and schedule

## 2018-06-28 NOTE — Telephone Encounter (Signed)
Called pt twice.. no able to leave a message

## 2018-07-29 ENCOUNTER — Encounter: Payer: Self-pay | Admitting: Internal Medicine

## 2018-08-27 ENCOUNTER — Other Ambulatory Visit: Payer: Self-pay

## 2018-08-27 ENCOUNTER — Encounter: Payer: Self-pay | Admitting: Internal Medicine

## 2018-08-28 ENCOUNTER — Encounter: Payer: Self-pay | Admitting: Internal Medicine

## 2018-08-28 ENCOUNTER — Ambulatory Visit: Payer: BC Managed Care – PPO | Admitting: Internal Medicine

## 2018-08-28 VITALS — BP 166/115 | HR 86 | Temp 98.9°F | Resp 16 | Ht 66.0 in | Wt 234.1 lb

## 2018-08-28 DIAGNOSIS — I10 Essential (primary) hypertension: Secondary | ICD-10-CM

## 2018-08-28 DIAGNOSIS — J302 Other seasonal allergic rhinitis: Secondary | ICD-10-CM | POA: Diagnosis not present

## 2018-08-28 MED ORDER — AZELASTINE HCL 0.1 % NA SOLN: 2.0000 | Freq: Two times a day (BID) | NASAL | 6 refills | Status: DC

## 2018-08-28 MED ORDER — PREDNISONE 10 MG PO TABS: ORAL_TABLET | ORAL | 0 refills | Status: DC

## 2018-08-28 NOTE — Progress Notes (Signed)
Pre visit review using our clinic review tool, if applicable. No additional management support is needed unless otherwise documented below in the visit note. 

## 2018-08-28 NOTE — Patient Instructions (Addendum)
GO TO THE FRONT DESK Schedule your next appointment    For a physical exam, fasting in 4 weeks   For allergies: Zyrtec OTC 1 tablet daily Flonase OTC once daily   Astelin twice a day Prednisone 5 days Mucinex DM as needed   Check the  blood pressure daily BP GOAL is between 110/65 and  135/85. If it is consistently higher or lower, let me know

## 2018-08-28 NOTE — Progress Notes (Signed)
Subjective:    Patient ID: Justin Elliott, male    DOB: Mar 04, 1965, 53 y.o.   MRN: [id number]  DOS:  08/28/2018 Type of visit - description: Acute visit Ongoing allergy symptoms since the last visit approximately 2 months ago: Ears feel full, throat congested. Takes onn-off antihistaminics, Mucinex DM and Flonase.  Does not take decongestants.  BP today is elevated, ambulatory BPs at home 130/90, has not taken his medications today.  BP Readings from Last 3 Encounters:  08/28/18 (!) 166/115  01/19/18 (!) 173/106  01/17/18 (!) 150/105     Review of Systems Denies fever chills + Itchy watery eyes on and off No chest pain, difficulty breathing. + Cough he thinks related to mucus pooling in the throat. No chest congestion no headaches.  Past Medical History:  Diagnosis Date  . Elevated serum creatinine   . Gout   . HTN (hypertension) 2009    Past Surgical History:  Procedure Laterality Date  . FINGER SURGERY Right 1984    Social History   Socioeconomic History  . Marital status: Married    Spouse name: Not on file  . Number of children: 2  . Years of education: Not on file  . Highest education level: Not on file  Occupational History  . Occupation: [employer]  Social Needs  . Financial resource strain: Not on file  . Food insecurity    Worry: Not on file    Inability: Not on file  . Transportation needs    Medical: Not on file    Non-medical: Not on file  Tobacco Use  . Smoking status: Never Smoker  . Smokeless tobacco: Never Used  Substance and Sexual Activity  . Alcohol use: No    Alcohol/week: 0.0 standard drinks  . Drug use: No  . Sexual activity: Not on file  Lifestyle  . Physical activity    Days per week: Not on file    Minutes per session: Not on file  . Stress: Not on file  Relationships  . Social Herbalist on phone: Not on file    Gets together: Not on file    Attends religious service: Not on file   Active member of club or organization: Not on file    Attends meetings of clubs or organizations: Not on file    Relationship status: Not on file  . Intimate partner violence    Fear of current or ex partner: Not on file    Emotionally abused: Not on file    Physically abused: Not on file    Forced sexual activity: Not on file  Other Topics Concern  . Not on file  Social History Narrative   Household-- wife, 2 children     D 2003 (lives at home)    [employer] (college)   PHD 2017      Allergies as of 08/28/2018      Reactions   Losartan    Sore throat, no lip-tongue swelling      Medication List       Accurate as of August 28, 2018  8:18 AM. If you have any questions, ask your nurse or doctor.        amLODipine 10 MG tablet Commonly known as: NORVASC Take 1 tablet (10 mg total) by mouth daily.   carvedilol 25 MG tablet Commonly known as: COREG Take 1 tablet (25 mg total) by mouth 2 (two) times daily with a meal.   colchicine  0.6 MG tablet Take 1 tablet (0.6 mg total) by mouth 2 (two) times daily as needed (gout).   cyclobenzaprine 10 MG tablet Commonly known as: FLEXERIL Take 1 tablet (10 mg total) by mouth at bedtime as needed for muscle spasms.   desonide 0.05 % cream Commonly known as: DESOWEN As directed   ketoconazole 2 % cream Commonly known as: NIZORAL As directed           Objective:   Physical Exam BP (!) 166/115 (BP Location: Right Arm, Patient Position: Sitting, Cuff Size: Small)   Pulse 86   Temp 98.9 F (37.2 C) (Oral)   Resp 16   Ht 5\' 6"  (1.676 m)   Wt 234 lb 2 oz (106.2 kg)   SpO2 99%   BMI 37.79 kg/m  General:   Well developed, NAD, BMI noted. HEENT:  Normocephalic . Face symmetric, atraumatic. Throat symmetric, no red TMs: Both are bulge, no redness.  Otherwise ears normal Nose is slightly congested Lungs:  CTA B Normal respiratory effort, no intercostal retractions, no accessory muscle use. Heart: RRR,  no murmur.  No  pretibial edema bilaterally  Skin: Not pale. Not jaundice Neurologic:  alert & oriented X3.  Speech normal, gait appropriate for age and unassisted Psych--  Cognition and judgment appear intact.  Cooperative with normal attention span and concentration.  Behavior appropriate. No anxious or depressed appearing.      Assessment     Assessment: HTN Gout: DX 08-2016 Slightly increased creatinine:  Renal US 2017 wnl, microalbumin (-). Likely d/t large muscle mass. Saw nephrology 11/2016, they rec good BP control, treat elevated cholesterol if needed, weight loss, avoid NSAIDs, keep hydrated, consider treatment w/ uric acid lowering meds  PLAN   Allergies: Recommend consistent use of an antihistamine such as Zyrtec, consistent use of Flonase. Add Astelin twice a day. Mucinex DM as needed Prednisone x5 days. Call if no better HTN: Did not take his medications today, BP is elevated.  Ambulatory BPs 130/90, admits that at times higher due to forgetting medications. Asked patient to check BPs daily, bring a log. RTC 1 month CPX

## 2018-08-29 NOTE — Assessment & Plan Note (Signed)
Allergies: Recommend consistent use of an antihistamine such as Zyrtec, consistent use of Flonase. Add Astelin twice a day. Mucinex DM as needed Prednisone x5 days. Call if no better HTN: Did not take his medications today, BP is elevated.  Ambulatory BPs 130/90, admits that at times higher due to forgetting medications. Asked patient to check BPs daily, bring a log. RTC 1 month CPX

## 2018-09-06 ENCOUNTER — Encounter: Payer: Self-pay | Admitting: Internal Medicine

## 2018-09-06 DIAGNOSIS — T7840XD Allergy, unspecified, subsequent encounter: Secondary | ICD-10-CM

## 2018-09-14 ENCOUNTER — Other Ambulatory Visit: Payer: Self-pay

## 2018-09-14 ENCOUNTER — Encounter: Payer: Self-pay | Admitting: Internal Medicine

## 2018-09-14 ENCOUNTER — Ambulatory Visit (INDEPENDENT_AMBULATORY_CARE_PROVIDER_SITE_OTHER): Payer: BC Managed Care – PPO | Admitting: Internal Medicine

## 2018-09-14 VITALS — BP 113/82 | HR 90 | Temp 97.7°F | Resp 16 | Ht 66.0 in | Wt 236.0 lb

## 2018-09-14 DIAGNOSIS — H6981 Other specified disorders of Eustachian tube, right ear: Secondary | ICD-10-CM

## 2018-09-14 DIAGNOSIS — Z0000 Encounter for general adult medical examination without abnormal findings: Secondary | ICD-10-CM

## 2018-09-14 LAB — CBC WITH DIFFERENTIAL/PLATELET
Basophils Absolute: 0 10*3/uL (ref 0.0–0.1)
Basophils Relative: 0.4 % (ref 0.0–3.0)
Eosinophils Absolute: 0.1 10*3/uL (ref 0.0–0.7)
Eosinophils Relative: 1.8 % (ref 0.0–5.0)
HCT: 40.7 % (ref 39.0–52.0)
Hemoglobin: 13.2 g/dL (ref 13.0–17.0)
Lymphocytes Relative: 37.2 % (ref 12.0–46.0)
Lymphs Abs: 1.4 10*3/uL (ref 0.7–4.0)
MCHC: 32.4 g/dL (ref 30.0–36.0)
MCV: 85.3 fl (ref 78.0–100.0)
Monocytes Absolute: 0.3 10*3/uL (ref 0.1–1.0)
Monocytes Relative: 7.4 % (ref 3.0–12.0)
Neutro Abs: 2 10*3/uL (ref 1.4–7.7)
Neutrophils Relative %: 53.2 % (ref 43.0–77.0)
Platelets: 210 10*3/uL (ref 150.0–400.0)
RBC: 4.77 Mil/uL (ref 4.22–5.81)
RDW: 13.9 % (ref 11.5–15.5)
WBC: 3.7 10*3/uL — ABNORMAL LOW (ref 4.0–10.5)

## 2018-09-14 LAB — COMPREHENSIVE METABOLIC PANEL
ALT: 16 U/L (ref 0–53)
AST: 15 U/L (ref 0–37)
Albumin: 4.3 g/dL (ref 3.5–5.2)
Alkaline Phosphatase: 58 U/L (ref 39–117)
BUN: 13 mg/dL (ref 6–23)
CO2: 26 mEq/L (ref 19–32)
Calcium: 9.9 mg/dL (ref 8.4–10.5)
Chloride: 104 mEq/L (ref 96–112)
Creatinine, Ser: 1.56 mg/dL — ABNORMAL HIGH (ref 0.40–1.50)
GFR: 46.66 mL/min — ABNORMAL LOW (ref >60.00)
Glucose, Bld: 102 mg/dL — ABNORMAL HIGH (ref 70–99)
Potassium: 3.9 mEq/L (ref 3.5–5.1)
Sodium: 138 mEq/L (ref 135–145)
Total Bilirubin: 0.5 mg/dL (ref 0.2–1.2)
Total Protein: 7.3 g/dL (ref 6.0–8.3)

## 2018-09-14 LAB — LIPID PANEL
Cholesterol: 197 mg/dL (ref 0–200)
HDL: 29.6 mg/dL — ABNORMAL LOW (ref >39.00)
NonHDL: 166.93
Total CHOL/HDL Ratio: 7
Triglycerides: 223 mg/dL — ABNORMAL HIGH (ref 0.0–149.0)
VLDL: 44.6 mg/dL — ABNORMAL HIGH (ref 0.0–40.0)

## 2018-09-14 LAB — LDL CHOLESTEROL, DIRECT: Direct LDL: 126 mg/dL

## 2018-09-14 MED ORDER — CARVEDILOL 25 MG PO TABS: 25.0000 mg | ORAL_TABLET | Freq: Two times a day (BID) | ORAL | 3 refills | Status: DC

## 2018-09-14 MED ORDER — AMLODIPINE BESYLATE 10 MG PO TABS: 10.0000 mg | ORAL_TABLET | Freq: Every day | ORAL | 3 refills | Status: DC

## 2018-09-14 MED ORDER — PREDNISONE 10 MG PO TABS: ORAL_TABLET | ORAL | 0 refills | Status: DC

## 2018-09-14 NOTE — Patient Instructions (Signed)
GO TO THE LAB : Get the blood work     GO TO THE FRONT DESK Schedule your next appointment   for a physical exam in 1 year  Continue taking all your medications regularly  For few days take another round of prednisone  We are referring you to the ENT doctor   Check the  blood pressure 2 times a month   BP GOAL is between 110/65 and  135/85. If it is consistently higher or lower, let me know

## 2018-09-14 NOTE — Progress Notes (Signed)
Subjective:    Patient ID: Justin Elliott, male    DOB: [birth year], 53 y.o.   MRN: [id number]  DOS:  09/14/2018 Type of visit - description: CPX In general feeling well. Continue with right ear pressure.  It got temporarily better with a round of prednisone.   Review of Systems  Other than above, a 14 point review of systems is negative     Past Medical History:  Diagnosis Date  . Elevated serum creatinine   . Gout   . HTN (hypertension) 2009    Past Surgical History:  Procedure Laterality Date  . FINGER SURGERY Right 1984    Social History   Socioeconomic History  . Marital status: Married    Spouse name: Not on file  . Number of children: 2  . Years of education: Not on file  . Highest education level: Not on file  Occupational History  . Occupation: [employer]  Social Needs  . Financial resource strain: Not on file  . Food insecurity    Worry: Not on file    Inability: Not on file  . Transportation needs    Medical: Not on file    Non-medical: Not on file  Tobacco Use  . Smoking status: Never Smoker  . Smokeless tobacco: Never Used  Substance and Sexual Activity  . Alcohol use: No    Alcohol/week: 0.0 standard drinks  . Drug use: No  . Sexual activity: Not on file  Lifestyle  . Physical activity    Days per week: Not on file    Minutes per session: Not on file  . Stress: Not on file  Relationships  . Social Herbalist on phone: Not on file    Gets together: Not on file    Attends religious service: Not on file    Active member of club or organization: Not on file    Attends meetings of clubs or organizations: Not on file    Relationship status: Not on file  . Intimate partner violence    Fear of current or ex partner: Not on file    Emotionally abused: Not on file    Physically abused: Not on file    Forced sexual activity: Not on file  Other Topics Concern  . Not on file  Social History Narrative   Household: wife, 1 children     D 2003 (lives at home)    [employer] (graduated , college)   PHD 2017     Family History  Problem Relation Age of Onset  . Prostate cancer Father 42       died 01-Apr-2015 age 21  . Breast cancer Father   . Breast cancer Sister   . Diabetes type I Son   . Stroke Mother 42  . Colon cancer Neg Hx   . CAD Neg Hx      Allergies as of 09/14/2018      Reactions   Losartan    Sore throat, no lip-tongue swelling      Medication List       Accurate as of September 14, 2018 11:59 PM. If you have any questions, ask your nurse or doctor.        amLODipine 10 MG tablet Commonly known as: NORVASC Take 1 tablet (10 mg total) by mouth daily.   azelastine 0.1 % nasal spray Commonly known as: ASTELIN Place 2 sprays into both nostrils 2 (two) times daily.   carvedilol 25  MG tablet Commonly known as: COREG Take 1 tablet (25 mg total) by mouth 2 (two) times daily with a meal.   colchicine 0.6 MG tablet Take 1 tablet (0.6 mg total) by mouth 2 (two) times daily as needed (gout).   cyclobenzaprine 10 MG tablet Commonly known as: FLEXERIL Take 1 tablet (10 mg total) by mouth at bedtime as needed for muscle spasms.   desonide 0.05 % cream Commonly known as: DESOWEN As directed   ketoconazole 2 % cream Commonly known as: NIZORAL As directed   predniSONE 10 MG tablet Commonly known as: DELTASONE 4 tablets x 2 days, 3 tabs x 2 days, 2 tabs x 2 days, 1 tab x 2 days What changed: additional instructions Changed by: Kathlene November, MD           Objective:   Physical Exam BP 113/82 (BP Location: Left Arm, Patient Position: Sitting, Cuff Size: Normal)   Pulse 90   Temp 97.7 F (36.5 C) (Oral)   Resp 16   Ht 5\' 6"  (1.676 m)   Wt 236 lb (107 kg)   SpO2 100%   BMI 38.09 kg/m  General: Well developed, NAD, BMI noted Neck: No  thyromegaly  HEENT:  Normocephalic . Face symmetric, atraumatic. TMs: Both are bulge, canal normal, no discharge or wax Lungs:  CTA  B Normal respiratory effort, no intercostal retractions, no accessory muscle use. Heart: RRR,  no murmur.  No pretibial edema bilaterally  Abdomen:  Not distended, soft, non-tender. No rebound or rigidity.   Skin: Exposed areas without rash. Not pale. Not jaundice Neurologic:  alert & oriented X3.  Speech normal, gait appropriate for age and unassisted Strength symmetric and appropriate for age.  Psych: Cognition and judgment appear intact.  Cooperative with normal attention span and concentration.  Behavior appropriate. No anxious or depressed appearing.     Assessment       Assessment: HTN Gout: DX 08-2016 Slightly increased creatinine:  Renal US 2017 wnl, microalbumin (-). Likely d/t large muscle mass. Saw nephrology 11/2016, they rec good BP control, treat elevated cholesterol if needed, weight loss, avoid NSAIDs, keep hydrated, consider treatment w/ uric acid lowering meds  PLAN   HTN: Since the last visit, is taking meds consistently, ambulatory BPs are normal. Allergies: See last visit, he continue with ear pressure mostly on the right side, exam show bulging TMs. He was referred to an allergist, will switch the referral to ENT, continue allergy medications, provide a second round of prednisone (first round helped temporarily). RTC 1 year

## 2018-09-14 NOTE — Assessment & Plan Note (Signed)
-   Td 2014 -Recommend early flu shot this year -- + FH prostate and breast cancer, also TIA stroke. --CCS: cscope 08-2015, 10 years --Prostate cancer screening: DRE - PSA  wnl 2019 -Diet, Exercise: Discussed, room for improvement , counseled - Labs: CMP, FLP, CBC

## 2018-09-14 NOTE — Progress Notes (Signed)
Pre visit review using our clinic review tool, if applicable. No additional management support is needed unless otherwise documented below in the visit note. 

## 2018-09-16 NOTE — Assessment & Plan Note (Signed)
HTN: Since the last visit, is taking meds consistently, ambulatory BPs are normal. Allergies: See last visit, he continue with ear pressure mostly on the right side, exam show bulging TMs. He was referred to an allergist, will switch the referral to ENT, continue allergy medications, provide a second round of prednisone (first round helped temporarily). RTC 1 year

## 2018-12-07 ENCOUNTER — Other Ambulatory Visit: Payer: Self-pay

## 2018-12-07 ENCOUNTER — Encounter: Payer: Self-pay | Admitting: Internal Medicine

## 2018-12-07 ENCOUNTER — Ambulatory Visit: Payer: BC Managed Care – PPO | Admitting: Internal Medicine

## 2018-12-07 ENCOUNTER — Ambulatory Visit: Payer: BC Managed Care – PPO

## 2018-12-07 VITALS — BP 133/92 | HR 78 | Temp 97.4°F | Resp 16 | Ht 66.0 in | Wt 237.0 lb

## 2018-12-07 DIAGNOSIS — Z23 Encounter for immunization: Secondary | ICD-10-CM

## 2018-12-07 DIAGNOSIS — K219 Gastro-esophageal reflux disease without esophagitis: Secondary | ICD-10-CM

## 2018-12-07 DIAGNOSIS — H6983 Other specified disorders of Eustachian tube, bilateral: Secondary | ICD-10-CM | POA: Diagnosis not present

## 2018-12-07 MED ORDER — PANTOPRAZOLE SODIUM 40 MG PO TBEC: 40.0000 mg | DELAYED_RELEASE_TABLET | Freq: Every day | ORAL | 3 refills | Status: DC

## 2018-12-07 MED ORDER — AZELASTINE HCL 0.1 % NA SOLN: 2.0000 | Freq: Two times a day (BID) | NASAL | 12 refills | Status: DC

## 2018-12-07 NOTE — Patient Instructions (Signed)
Hildon, to help your ear discomfort I recommend the following  Get Flonase over-the-counter, 2 sprays on each side of the nose every day.  It is okay to get the generic  Continue using Astelin 2 sprays on each side of the nose twice a day  For the next month, take pantoprazole, an acid reducer, 1 tablet before your breakfast every day, after a month okay to use it as needed  Continue Claritin over-the-counter  If all the above is not helping with your ears please see the ENT doctor again

## 2018-12-07 NOTE — Progress Notes (Signed)
Subjective:    Patient ID: Justin Elliott, male    DOB: 10-Mar-1965, 53 y.o.   MRN: [id number]  DOS:  12/07/2018 Type of visit - description: Acute Continue with on and off ear discomfort, bilaterally, poorly defined, just like congestion or pressure. Saw ENT 08/03/2018, a nasal endoscopy was done (see report below), DX ET dysfunction.  Also GERD. Currently taking Claritin, Astelin and Mucinex.  Not taking PPIs.  Procedure: Nasal Endoscopy Surgeon: Dr. Gavin Pound  Details: After Afrin and lidocaine were applied topically to both sides of the nose, a flexible 51mm endoscope was utilized to perform bilateral nasal examination, then removed.  Findings are as follows: The septum appears straight. Inferior turbinates are normal in size and appearance. The middle turbinates. The left middle turbinate is somewhat polypoid and paradoxical in appearance. There is swelling of the left OMC, but no masses. There are no significant secretions, no polyps, no lesions. The nose remains patent. The choana is normal in appearance. The torus tubarius is unobstructed.     Review of Systems  Denies runny nose sore sore throat Occasionally has allergy symptoms such as itchy eyes and nose No classic heartburn  Past Medical History:  Diagnosis Date  . Elevated serum creatinine   . Gout   . HTN (hypertension) 2009  . Polypoid middle turbinate     Past Surgical History:  Procedure Laterality Date  . FINGER SURGERY Right 1984  . NASAL ENDOSCOPY      Social History   Socioeconomic History  . Marital status: Married    Spouse name: Not on file  . Number of children: 2  . Years of education: Not on file  . Highest education level: Not on file  Occupational History  . Occupation: [employer]  Social Needs  . Financial resource strain: Not on file  . Food insecurity    Worry: Not on file    Inability: Not on file  . Transportation needs    Medical: Not on file    Non-medical: Not on file  Tobacco Use  . Smoking status: Never Smoker  . Smokeless tobacco: Never Used  Substance and Sexual Activity  . Alcohol use: No    Alcohol/week: 0.0 standard drinks  . Drug use: No  . Sexual activity: Not on file  Lifestyle  . Physical activity    Days per week: Not on file    Minutes per session: Not on file  . Stress: Not on file  Relationships  . Social Herbalist on phone: Not on file    Gets together: Not on file    Attends religious service: Not on file    Active member of club or organization: Not on file    Attends meetings of clubs or organizations: Not on file    Relationship status: Not on file  . Intimate partner violence    Fear of current or ex partner: Not on file    Emotionally abused: Not on file    Physically abused: Not on file    Forced sexual activity: Not on file  Other Topics Concern  . Not on file  Social History Narrative   Household: wife, 1 children     D 2003 (lives at home)    [place] (graduated , college)   PHD 2017      Allergies as of 12/07/2018      Reactions   Losartan    Sore throat, no lip-tongue swelling  Medication List       Accurate as of December 07, 2018  9:46 AM. If you have any questions, ask your nurse or doctor.        amLODipine 10 MG tablet Commonly known as: NORVASC Take 1 tablet (10 mg total) by mouth daily.   azelastine 0.1 % nasal spray Commonly known as: ASTELIN Place 2 sprays into both nostrils 2 (two) times daily.   carvedilol 25 MG tablet Commonly known as: COREG Take 1 tablet (25 mg total) by mouth 2 (two) times daily with a meal.   colchicine 0.6 MG tablet Take 1 tablet (0.6 mg total) by mouth 2 (two) times daily as needed (gout).   cyclobenzaprine 10 MG tablet Commonly known as: FLEXERIL Take 1 tablet (10 mg total) by mouth at bedtime as needed for muscle spasms.   desonide 0.05 % cream Commonly known as: DESOWEN As directed   ketoconazole 2 % cream  Commonly known as: NIZORAL As directed   predniSONE 10 MG tablet Commonly known as: DELTASONE 4 tablets x 2 days, 3 tabs x 2 days, 2 tabs x 2 days, 1 tab x 2 days           Objective:   Physical Exam BP (!) 133/92 (BP Location: Left Arm, Patient Position: Sitting, Cuff Size: Normal)   Pulse 78   Temp (!) 97.4 F (36.3 C) (Temporal)   Resp 16   Ht 5\' 6"  (1.676 m)   Wt 237 lb (107.5 kg)   SpO2 98%   BMI 38.25 kg/m   General:   Well developed, NAD, BMI noted. HEENT:  Normocephalic . Face symmetric, atraumatic and both TMs are bulging, more on the left.  Canal normal. Throat: Symmetric Nose congested  Skin: Not pale. Not jaundice Neurologic:  alert & oriented X3.  Speech normal, gait appropriate for age and unassisted Psych--  Cognition and judgment appear intact.  Cooperative with normal attention span and concentration.  Behavior appropriate. No anxious or depressed appearing.      Assessment      Assessment: HTN Gout: DX 08-2016 Slightly increased creatinine:  Renal US 2017 wnl, microalbumin (-). Likely d/t large muscle mass. Saw nephrology 11/2016, they rec good BP control, treat elevated cholesterol if needed, weight loss, avoid NSAIDs, keep hydrated, consider treatment w/ uric acid lowering meds  PLAN   ET dysfunction, GERD: Ear discomfort due to above. Recommend consistent use of Astelin, Flonase, Claritin and pantoprazole for 4 weeks.  If he is not better, recommend to see ENT again.  Ear tubes?. Preventive care: Flu shot today

## 2018-12-07 NOTE — Progress Notes (Signed)
Pre visit review using our clinic review tool, if applicable. No additional management support is needed unless otherwise documented below in the visit note. 

## 2018-12-08 NOTE — Assessment & Plan Note (Signed)
ET dysfunction, GERD: Ear discomfort due to above. Recommend consistent use of Astelin, Flonase, Claritin and pantoprazole for 4 weeks.  If he is not better, recommend to see ENT again.  Ear tubes?. Preventive care: Flu shot today

## 2018-12-29 ENCOUNTER — Encounter: Payer: Self-pay | Admitting: Internal Medicine

## 2018-12-31 ENCOUNTER — Other Ambulatory Visit: Payer: Self-pay

## 2018-12-31 ENCOUNTER — Ambulatory Visit (INDEPENDENT_AMBULATORY_CARE_PROVIDER_SITE_OTHER): Payer: BC Managed Care – PPO | Admitting: Internal Medicine

## 2018-12-31 DIAGNOSIS — H6983 Other specified disorders of Eustachian tube, bilateral: Secondary | ICD-10-CM

## 2018-12-31 MED ORDER — PREDNISONE 10 MG PO TABS: ORAL_TABLET | ORAL | 0 refills | Status: DC

## 2018-12-31 NOTE — Progress Notes (Signed)
Subjective:    Patient ID: Justin Elliott, male    DOB: 1965-11-24, 53 y.o.   MRN: [id number]  DOS:  12/31/2018 Type of visit - description:  Attempted  to make this a video visit, due to technical difficulties from the patient side it was not possible  thus we proceeded with a Virtual Visit via Telephone    I connected with@   by telephone and verified that I am speaking with the correct person using two identifiers.  THIS ENCOUNTER IS A VIRTUAL VISIT DUE TO COVID-19 - PATIENT WAS NOT SEEN IN THE OFFICE. PATIENT HAS CONSENTED TO VIRTUAL VISIT / TELEMEDICINE VISIT   Location of patient: home  Location of provider: office  I discussed the limitations, risks, security and privacy concerns of performing an evaluation and management service by telephone and the availability of in person appointments. I also discussed with the patient that there may be a patient responsible charge related to this service. The patient expressed understanding and agreed to proceed.   History of Present Illness: Acute Ongoing ear discomfort, feeling clogged despite taking most days Astelin, Flonase, Claritin. He took pantoprazole but again reports he has absolutely no heartburn and self discontinue it.    Review of Systems Denies fever chills No sore throat No heartburn Occasionally blows a small amount of clear discharge from the nose.  Past Medical History:  Diagnosis Date  . Elevated serum creatinine   . Gout   . HTN (hypertension) 2009  . Polypoid middle turbinate     Past Surgical History:  Procedure Laterality Date  . FINGER SURGERY Right 1984  . NASAL ENDOSCOPY      Social History   Socioeconomic History  . Marital status: Married    Spouse name: Not on file  . Number of children: 2  . Years of education: Not on file  . Highest education level: Not on file  Occupational History  . Occupation: [employer]  Social Needs  . Financial resource strain: Not on  file  . Food insecurity    Worry: Not on file    Inability: Not on file  . Transportation needs    Medical: Not on file    Non-medical: Not on file  Tobacco Use  . Smoking status: Never Smoker  . Smokeless tobacco: Never Used  Substance and Sexual Activity  . Alcohol use: No    Alcohol/week: 0.0 standard drinks  . Drug use: No  . Sexual activity: Not on file  Lifestyle  . Physical activity    Days per week: Not on file    Minutes per session: Not on file  . Stress: Not on file  Relationships  . Social Herbalist on phone: Not on file    Gets together: Not on file    Attends religious service: Not on file    Active member of club or organization: Not on file    Attends meetings of clubs or organizations: Not on file    Relationship status: Not on file  . Intimate partner violence    Fear of current or ex partner: Not on file    Emotionally abused: Not on file    Physically abused: Not on file    Forced sexual activity: Not on file  Other Topics Concern  . Not on file  Social History Narrative   Household: wife, 1 children     D 2003 (lives at home)    [employer] (graduated ,  college)   PHD 2017      Allergies as of 12/31/2018      Reactions   Losartan    Sore throat, no lip-tongue swelling      Medication List       Accurate as of December 31, 2018  3:45 PM. If you have any questions, ask your nurse or doctor.        amLODipine 10 MG tablet Commonly known as: NORVASC Take 1 tablet (10 mg total) by mouth daily.   azelastine 0.1 % nasal spray Commonly known as: ASTELIN Place 2 sprays into both nostrils 2 (two) times daily.   carvedilol 25 MG tablet Commonly known as: COREG Take 1 tablet (25 mg total) by mouth 2 (two) times daily with a meal.   colchicine 0.6 MG tablet Take 1 tablet (0.6 mg total) by mouth 2 (two) times daily as needed (gout).   cyclobenzaprine 10 MG tablet Commonly known as: FLEXERIL Take 1 tablet (10 mg total) by mouth at  bedtime as needed for muscle spasms.   desonide 0.05 % cream Commonly known as: DESOWEN As directed   ketoconazole 2 % cream Commonly known as: NIZORAL As directed   pantoprazole 40 MG tablet Commonly known as: PROTONIX Take 1 tablet (40 mg total) by mouth daily before breakfast.           Objective:   Physical Exam There were no vitals taken for this visit. This is a virtual telephone visit, he sounded well, voice slightly nasal.    Assessment    Assessment: HTN Gout: DX 08-2016 Slightly increased creatinine:  Renal US 2017 wnl, microalbumin (-). Likely d/t large muscle mass. Saw nephrology 11/2016, they rec good BP control, treat elevated cholesterol if needed, weight loss, avoid NSAIDs, keep hydrated, consider treatment w/ uric acid lowering meds  PLAN  ET dysfunction: Despite taking Astelin, Flonase and Claritin and sometimes omeprazole ; symptoms have not changed, the only thing that help in the past is around the steroids on a daily request a prescription. No history of DM. I agreed to send a prescription for prednisone as a temporary help but strongly recommend to call ENT and make another appointment,  might need ear tubes. He verbalized understanding    I discussed the assessment and treatment plan with the patient. The patient was provided an opportunity to ask questions and all were answered. The patient agreed with the plan and demonstrated an understanding of the instructions.   The patient was advised to call back or seek an in-person evaluation if the symptoms worsen or if the condition fails to improve as anticipated.  I provided 12  minutes of non-face-to-face time during this encounter.  Kathlene November, MD

## 2019-01-02 NOTE — Assessment & Plan Note (Signed)
ET dysfunction: Despite taking Astelin, Flonase and Claritin and sometimes omeprazole ; symptoms have not changed, the only thing that help in the past is around the steroids on a daily request a prescription. No history of DM. I agreed to send a prescription for prednisone as a temporary help but strongly recommend to call ENT and make another appointment,  might need ear tubes. He verbalized understanding

## 2019-01-03 IMAGING — DX DG CHEST 2V
2 series · 3 of 3 positions shown · non-contrast
Comparison: None.

CLINICAL DATA: Night sweats for the past month.  Nonsmoker.

EXAM:
CHEST - 2 VIEW

[chest pa]
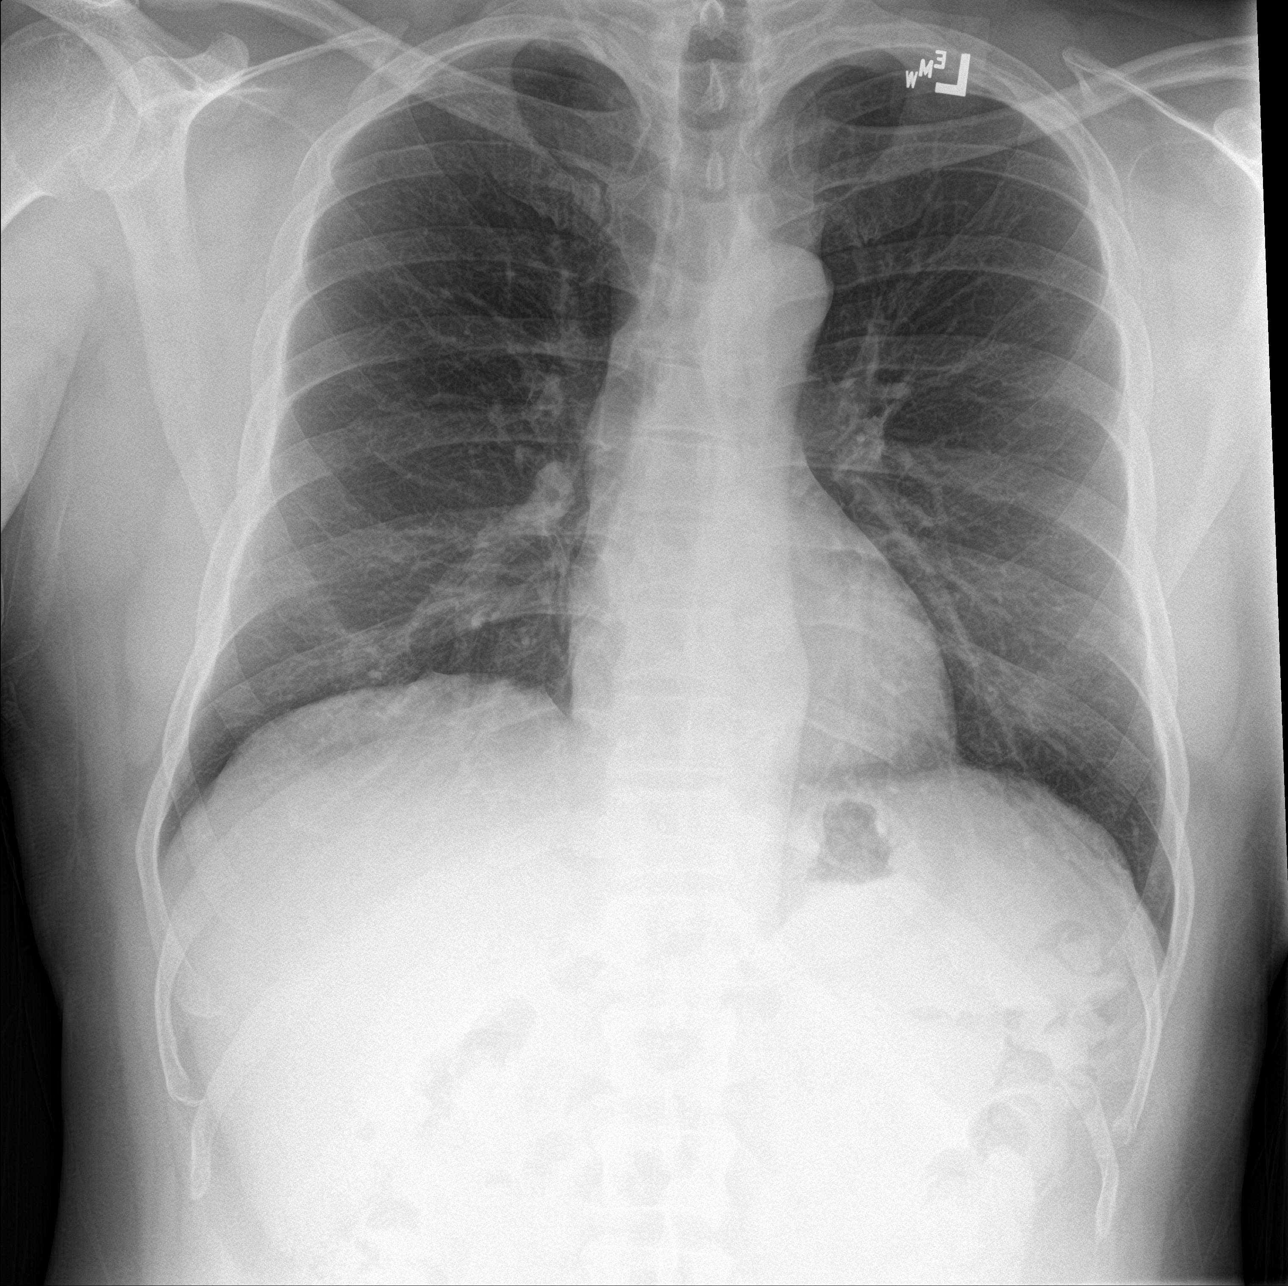

[Series 2: chest lat · 0.14mm/px · 2 of 2 slices shown]
[im 1/2]
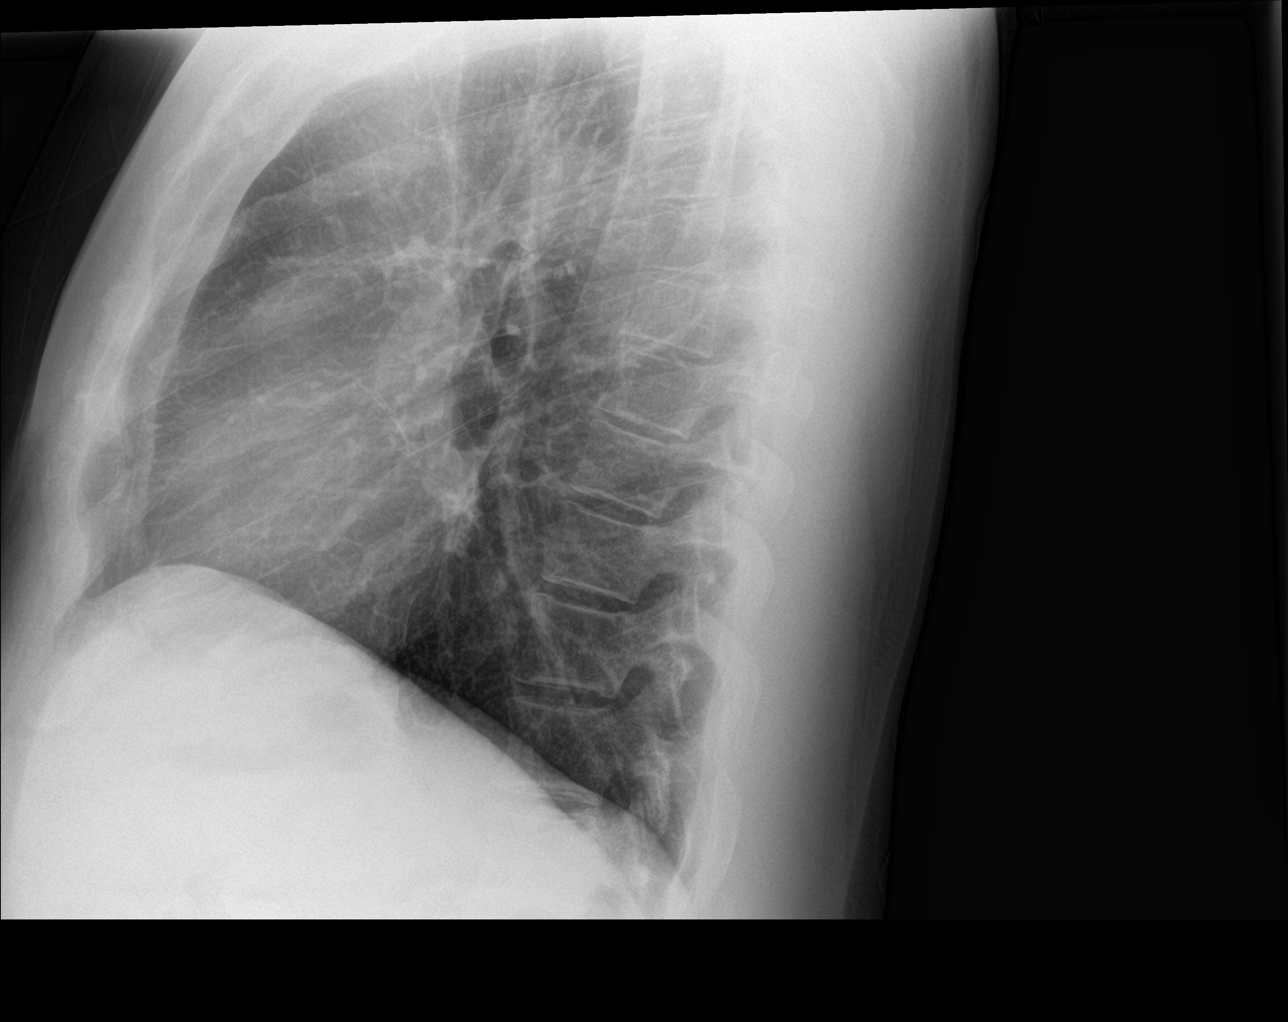
[im 2/2]
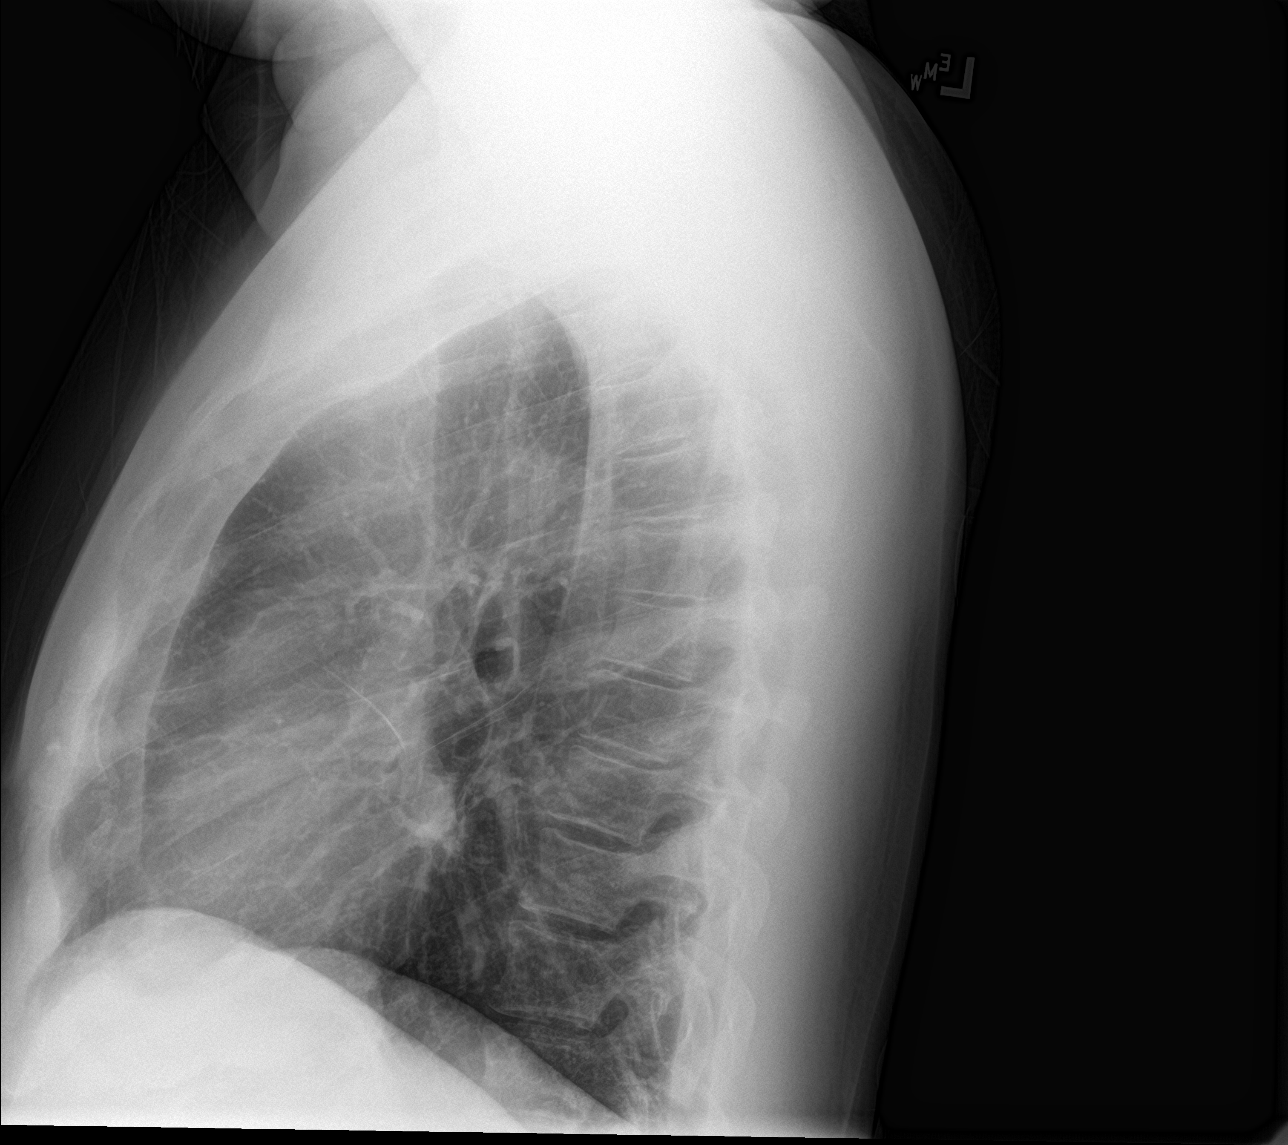

[3 of 3 positions shown; findings below may reference images not displayed]

FINDINGS: The lungs are well-expanded. There is no focal infiltrate. There is
no pleural effusion. The heart and pulmonary vascularity are normal.
The mediastinum is normal in width. There is no hilar
lymphadenopathy. The trachea is midline. The bony thorax exhibits no
acute abnormality.
IMPRESSION: There is no active cardiopulmonary disease.

## 2019-03-15 ENCOUNTER — Encounter: Payer: Self-pay | Admitting: Internal Medicine

## 2019-03-15 MED ORDER — COLCHICINE 0.6 MG PO TABS: 0.6000 mg | ORAL_TABLET | Freq: Two times a day (BID) | ORAL | 1 refills | Status: DC | PRN

## 2019-06-17 ENCOUNTER — Encounter: Payer: Self-pay | Admitting: Internal Medicine

## 2019-06-17 DIAGNOSIS — J302 Other seasonal allergic rhinitis: Secondary | ICD-10-CM

## 2019-08-16 ENCOUNTER — Ambulatory Visit: Payer: BC Managed Care – PPO | Admitting: Allergy

## 2019-09-03 ENCOUNTER — Other Ambulatory Visit: Payer: Self-pay | Admitting: Internal Medicine

## 2019-09-16 ENCOUNTER — Other Ambulatory Visit: Payer: Self-pay | Admitting: Internal Medicine

## 2019-09-20 ENCOUNTER — Other Ambulatory Visit: Payer: Self-pay

## 2019-09-20 ENCOUNTER — Encounter: Payer: Self-pay | Admitting: Internal Medicine

## 2019-09-20 ENCOUNTER — Ambulatory Visit (INDEPENDENT_AMBULATORY_CARE_PROVIDER_SITE_OTHER): Payer: BC Managed Care – PPO | Admitting: Internal Medicine

## 2019-09-20 VITALS — BP 157/106 | HR 73 | Temp 98.2°F | Resp 18 | Ht 66.0 in | Wt 235.7 lb

## 2019-09-20 DIAGNOSIS — R739 Hyperglycemia, unspecified: Secondary | ICD-10-CM | POA: Diagnosis not present

## 2019-09-20 DIAGNOSIS — Z125 Encounter for screening for malignant neoplasm of prostate: Secondary | ICD-10-CM | POA: Diagnosis not present

## 2019-09-20 DIAGNOSIS — Z0000 Encounter for general adult medical examination without abnormal findings: Secondary | ICD-10-CM

## 2019-09-20 DIAGNOSIS — R972 Elevated prostate specific antigen [PSA]: Secondary | ICD-10-CM | POA: Diagnosis not present

## 2019-09-20 LAB — CBC WITH DIFFERENTIAL/PLATELET
Basophils Absolute: 0 10*3/uL (ref 0.0–0.1)
Basophils Relative: 0.6 % (ref 0.0–3.0)
Eosinophils Absolute: 0.1 10*3/uL (ref 0.0–0.7)
Eosinophils Relative: 1.7 % (ref 0.0–5.0)
HCT: 42.1 % (ref 39.0–52.0)
Hemoglobin: 14 g/dL (ref 13.0–17.0)
Lymphocytes Relative: 38.3 % (ref 12.0–46.0)
Lymphs Abs: 1.6 10*3/uL (ref 0.7–4.0)
MCHC: 33.2 g/dL (ref 30.0–36.0)
MCV: 84.5 fl (ref 78.0–100.0)
Monocytes Absolute: 0.3 10*3/uL (ref 0.1–1.0)
Monocytes Relative: 7.3 % (ref 3.0–12.0)
Neutro Abs: 2.1 10*3/uL (ref 1.4–7.7)
Neutrophils Relative %: 52.1 % (ref 43.0–77.0)
Platelets: 232 10*3/uL (ref 150.0–400.0)
RBC: 4.98 Mil/uL (ref 4.22–5.81)
RDW: 13.5 % (ref 11.5–15.5)
WBC: 4.1 10*3/uL (ref 4.0–10.5)

## 2019-09-20 LAB — COMPREHENSIVE METABOLIC PANEL
ALT: 21 U/L (ref 0–53)
AST: 17 U/L (ref 0–37)
Albumin: 4.3 g/dL (ref 3.5–5.2)
Alkaline Phosphatase: 68 U/L (ref 39–117)
BUN: 14 mg/dL (ref 6–23)
CO2: 25 mEq/L (ref 19–32)
Calcium: 9.6 mg/dL (ref 8.4–10.5)
Chloride: 105 mEq/L (ref 96–112)
Creatinine, Ser: 1.5 mg/dL (ref 0.40–1.50)
GFR: 48.63 mL/min — ABNORMAL LOW (ref >60.00)
Glucose, Bld: 107 mg/dL — ABNORMAL HIGH (ref 70–99)
Potassium: 3.9 mEq/L (ref 3.5–5.1)
Sodium: 140 mEq/L (ref 135–145)
Total Bilirubin: 0.6 mg/dL (ref 0.2–1.2)
Total Protein: 7.2 g/dL (ref 6.0–8.3)

## 2019-09-20 LAB — LIPID PANEL
Cholesterol: 202 mg/dL — ABNORMAL HIGH (ref 0–200)
HDL: 27.8 mg/dL — ABNORMAL LOW (ref >39.00)
LDL Cholesterol: 138 mg/dL — ABNORMAL HIGH (ref 0–99)
NonHDL: 174.65
Total CHOL/HDL Ratio: 7
Triglycerides: 181 mg/dL — ABNORMAL HIGH (ref 0.0–149.0)
VLDL: 36.2 mg/dL (ref 0.0–40.0)

## 2019-09-20 LAB — TSH: TSH: 2.2 u[IU]/mL (ref 0.35–4.50)

## 2019-09-20 LAB — PSA: PSA: 5.84 ng/mL — ABNORMAL HIGH (ref 0.10–4.00)

## 2019-09-20 MED ORDER — AMLODIPINE BESYLATE 10 MG PO TABS: 10.0000 mg | ORAL_TABLET | Freq: Every day | ORAL | 1 refills | Status: DC

## 2019-09-20 MED ORDER — CARVEDILOL 25 MG PO TABS: 25.0000 mg | ORAL_TABLET | Freq: Two times a day (BID) | ORAL | 1 refills | Status: DC

## 2019-09-20 NOTE — Patient Instructions (Addendum)
Recommend to go back on your routine blood pressure medicines.  Check the  blood pressure 2 or 3 times a week BP GOAL is between 110/65 and  135/85. If it is consistently higher or lower, let me know  Get your flu shot this fall GO TO THE LAB : Get the blood work     [place], [place] back for a checkup in 6 weeks

## 2019-09-20 NOTE — Progress Notes (Signed)
Subjective:    Patient ID: Justin Elliott, male    DOB: [birth year], 54 y.o.   MRN: [id number]  DOS:  09/20/2019 Type of visit - description: Here for CPX Ran out of BP meds 3 days ago.  BP today is elevated.   Also pt thinks he may be having side effects from meds   Review of Systems  Other than above, a 14 point review of systems is negative     Past Medical History:  Diagnosis Date  . Elevated serum creatinine   . Gout   . HTN (hypertension) 2009  . Polypoid middle turbinate     Past Surgical History:  Procedure Laterality Date  . FINGER SURGERY Right 1984  . NASAL ENDOSCOPY      Allergies as of 09/20/2019      Reactions   Losartan    Sore throat, no lip-tongue swelling      Medication List       Accurate as of September 20, 2019 11:59 PM. If you have any questions, ask your nurse or doctor.        STOP taking these medications   azelastine 0.1 % nasal spray Commonly known as: ASTELIN Stopped by: Kathlene November, MD   predniSONE 10 MG tablet Commonly known as: DELTASONE Stopped by: Kathlene November, MD     TAKE these medications   amLODipine 10 MG tablet Commonly known as: NORVASC Take 1 tablet (10 mg total) by mouth daily.   carvedilol 25 MG tablet Commonly known as: COREG Take 1 tablet (25 mg total) by mouth 2 (two) times daily with a meal.   colchicine 0.6 MG tablet Take 1 tablet (0.6 mg total) by mouth 2 (two) times daily as needed (gout).   cyclobenzaprine 10 MG tablet Commonly known as: FLEXERIL Take 1 tablet (10 mg total) by mouth at bedtime as needed for muscle spasms.   desonide 0.05 % cream Commonly known as: DESOWEN As directed   ketoconazole 2 % cream Commonly known as: NIZORAL As directed          Objective:   Physical Exam BP (!) 157/106   Pulse 73   Temp 98.2 F (36.8 C) (Oral)   Resp 18   Ht 5\' 6"  (1.676 m)   Wt 235 lb 11.2 oz (106.9 kg)   SpO2 97%   BMI 38.04 kg/m  General: Well developed, NAD, BMI noted Neck: No   thyromegaly  HEENT:  Normocephalic . Face symmetric, atraumatic Chest wall: Has a small amount of normal breast tissue. Lungs:  CTA B Normal respiratory effort, no intercostal retractions, no accessory muscle use. Heart: RRR,  no murmur.  Abdomen:  Not distended, soft, non-tender. No rebound or rigidity.   Lower extremities: no pretibial edema bilaterally  Skin: Exposed areas without rash. Not pale. Not jaundice DRE: Exam somewhat limited by patient habitus but prostate seems normal, not tender.  No stools Neurologic:  alert & oriented X3.  Speech normal, gait appropriate for age and unassisted Strength symmetric and appropriate for age.  Psych: Cognition and judgment appear intact.  Cooperative with normal attention span and concentration.  Behavior appropriate. No anxious or depressed appearing.     Assessment     Assessment: HTN Gout: DX 08-2016 Slightly increased creatinine:  Renal US 2017 wnl, microalbumin (-). Likely d/t large muscle mass. Saw nephrology 11/2016, they rec good BP control, treat elevated cholesterol if needed, weight loss, avoid NSAIDs, keep hydrated, consider treatment w/ uric acid lowering meds  PLAN  Here for CPX HTN: BP today is elevated, 3 days ago he ran out of amlodipine, carvedilol.  He feels BP meds are causing "dry his throat", in the past he said losartan caused "sore throat". For now I recommend to go back on amlodipine and carvedilol, Rx sent, come back in 6 weeks. Monitor BPs at home  ET dysfunction: Resolved RTC 6 months      This visit occurred during the SARS-CoV-2 public health emergency.  Safety protocols were in place, including screening questions prior to the visit, additional usage of staff PPE, and extensive cleaning of exam room while observing appropriate contact time as indicated for disinfecting solutions.

## 2019-09-22 ENCOUNTER — Encounter: Payer: Self-pay | Admitting: Internal Medicine

## 2019-09-22 NOTE — Assessment & Plan Note (Signed)
-   Td 2014 -Covid vaccination x 2 , ~ may 2021, moderna  -Rec flu shot q. year -- + FH for  prostate, breast cancer, TIA stroke. --CCS: cscope 08-2015, 10 years --Prostate cancer screening: DRE normal, check a PSA, no symptoms.  -Diet, Exercise: Encouraged a healthy lifestyle -Labs:CMP, FLP, CBC, TSH, PSA,

## 2019-09-22 NOTE — Assessment & Plan Note (Signed)
Here for CPX HTN: BP today is elevated, 3 days ago he ran out of amlodipine, carvedilol.  He feels BP meds are causing "dry his throat", in the past he said losartan caused "sore throat". For now I recommend to go back on amlodipine and carvedilol, Rx sent, come back in 6 weeks. Monitor BPs at home  ET dysfunction: Resolved RTC 6 months

## 2019-09-23 NOTE — Addendum Note (Signed)
Addended byDamita Dunnings D on: 09/23/2019 03:22 PM   Modules accepted: Orders

## 2019-10-28 ENCOUNTER — Ambulatory Visit: Payer: BC Managed Care – PPO | Admitting: Internal Medicine

## 2019-10-28 ENCOUNTER — Encounter: Payer: Self-pay | Admitting: Internal Medicine

## 2019-10-28 ENCOUNTER — Other Ambulatory Visit: Payer: Self-pay

## 2019-10-28 VITALS — BP 143/94 | HR 82 | Temp 98.3°F | Resp 16 | Ht 66.0 in | Wt 232.0 lb

## 2019-10-28 DIAGNOSIS — Z23 Encounter for immunization: Secondary | ICD-10-CM | POA: Diagnosis not present

## 2019-10-28 DIAGNOSIS — I10 Essential (primary) hypertension: Secondary | ICD-10-CM

## 2019-10-28 DIAGNOSIS — R972 Elevated prostate specific antigen [PSA]: Secondary | ICD-10-CM

## 2019-10-28 NOTE — Patient Instructions (Addendum)
Check the  blood pressure weekly BP GOAL is between 110/65 and  135/85. If it is consistently higher or lower, let me know  Same medications, watch her salt intake  GO TO THE FRONT DESK, [place] back for  blood work in November  Come back for a checkup in person in 6 months

## 2019-10-28 NOTE — Progress Notes (Signed)
Subjective:    Patient ID: Justin Elliott, male    DOB: [birth year], 54 y.o.   MRN: [id number]  DOS:  10/28/2019 Type of visit - description: f/u Since the last office visit he is feeling well. We talk about elevated blood sugar, elevated PSA, blood pressure.   Review of Systems Denies dysuria, gross hematuria.   Past Medical History:  Diagnosis Date  . Elevated serum creatinine   . Gout   . HTN (hypertension) 2009  . Polypoid middle turbinate     Past Surgical History:  Procedure Laterality Date  . FINGER SURGERY Right 1984  . NASAL ENDOSCOPY      Allergies as of 10/28/2019      Reactions   Losartan    Sore throat, no lip-tongue swelling      Medication List       Accurate as of October 28, 2019 11:59 PM. If you have any questions, ask your nurse or doctor.        STOP taking these medications   cyclobenzaprine 10 MG tablet Commonly known as: FLEXERIL Stopped by: Kathlene November, MD   desonide 0.05 % cream Commonly known as: DESOWEN Stopped by: Kathlene November, MD   ketoconazole 2 % cream Commonly known as: NIZORAL Stopped by: Kathlene November, MD     TAKE these medications   amLODipine 10 MG tablet Commonly known as: NORVASC Take 1 tablet (10 mg total) by mouth daily.   carvedilol 25 MG tablet Commonly known as: COREG Take 1 tablet (25 mg total) by mouth 2 (two) times daily with a meal.   colchicine 0.6 MG tablet Take 1 tablet (0.6 mg total) by mouth 2 (two) times daily as needed (gout).          Objective:   Physical Exam BP (!) 143/94 (BP Location: Left Arm, Patient Position: Sitting, Cuff Size: Normal)   Pulse 82   Temp 98.3 F (36.8 C) (Oral)   Resp 16   Ht 5\' 6"  (1.676 m)   Wt 232 lb (105.2 kg)   SpO2 98%   BMI 37.45 kg/m  General:   Well developed, NAD, BMI noted. HEENT:  Normocephalic . Face symmetric, atraumatic Lungs:  CTA B Normal respiratory effort, no intercostal retractions, no accessory muscle use. Heart: RRR,  no murmur.  Lower  extremities: no pretibial edema bilaterally  Skin: Not pale. Not jaundice Neurologic:  alert & oriented X3.  Speech normal, gait appropriate for age and unassisted Psych--  Cognition and judgment appear intact.  Cooperative with normal attention span and concentration.  Behavior appropriate. No anxious or depressed appearing.      Assessment      Assessment: HTN Gout: DX 08-2016 Slightly increased creatinine:  Renal US 2017 wnl, microalbumin (-). Likely d/t large muscle mass. Saw nephrology 11/2016, they rec good BP control, treat elevated cholesterol if needed, weight loss, avoid NSAIDs, keep hydrated, consider treatment w/ uric acid lowering meds  PLAN  HTN: Reports good compliance with amlodipine and carvedilol (except the last 3 days he has been out of town).  Ambulatory BPs typically 130s/87-92. Very close to his diastolic BP goal (<16).  We talk about possibly add a med  but at the end we agreed to stay on amlodipine, carvedilol.  Watch Na+ intake. Hyperglycemia: Noted on last labs, check A1c,  see instructions Increase PSA: Was 5.8 on 09/20/2019, DRE at the time was limited but normal, no symptoms.  We agreed to check PSA in November. Preventive care: Had  COVID shots at the [place], got a flu shot today. RTC November labs RTC office visit 6 months     This visit occurred during the SARS-CoV-2 public health emergency.  Safety protocols were in place, including screening questions prior to the visit, additional usage of staff PPE, and extensive cleaning of exam room while observing appropriate contact time as indicated for disinfecting solutions.

## 2019-10-28 NOTE — Progress Notes (Signed)
Pre visit review using our clinic review tool, if applicable. No additional management support is needed unless otherwise documented below in the visit note. 

## 2019-10-29 NOTE — Assessment & Plan Note (Signed)
HTN: Reports good compliance with amlodipine and carvedilol (except the last 3 days he has been out of town).  Ambulatory BPs typically 130s/87-92. Very close to his diastolic BP goal (<44).  We talk about possibly add a med  but at the end we agreed to stay on amlodipine, carvedilol.  Watch Na+ intake. Hyperglycemia: Noted on last labs, check A1c,  see instructions Increase PSA: Was 5.8 on 09/20/2019, DRE at the time was limited but normal, no symptoms.  We agreed to check PSA in November. Preventive care: Had COVID shots at the [place], got a flu shot today. RTC November labs RTC office visit 6 months

## 2019-12-11 ENCOUNTER — Encounter: Payer: Self-pay | Admitting: Internal Medicine

## 2019-12-11 DIAGNOSIS — M25559 Pain in unspecified hip: Secondary | ICD-10-CM

## 2019-12-11 DIAGNOSIS — J302 Other seasonal allergic rhinitis: Secondary | ICD-10-CM

## 2019-12-16 MED ORDER — COLCHICINE 0.6 MG PO TABS: 0.6000 mg | ORAL_TABLET | Freq: Two times a day (BID) | ORAL | 1 refills | Status: DC | PRN

## 2019-12-16 NOTE — Addendum Note (Signed)
Addended byDamita Dunnings D on: 12/16/2019 10:59 AM   Modules accepted: Orders

## 2019-12-27 NOTE — Progress Notes (Deleted)
   Subjective:    I'm seeing this patient as a consultation for:  Dr. Larose Kells. Note will be routed back to referring provider/PCP.  CC: L hip pain  I, Aela Bohan, LAT, ATC, am serving as scribe for Dr. Lynne Leader.  HPI: Pt is a 54 y/o male presenting w/ c/o L hip pain .  He locates his pain to .  Radiating pain: L hip mechanical symptoms: Aggravating factors: Treatments tried:   Past medical history, Surgical history, Family history, Social history, Allergies, and medications have been entered into the medical record, reviewed. ***  Review of Systems: No new headache, visual changes, nausea, vomiting, diarrhea, constipation, dizziness, abdominal pain, skin rash, fevers, chills, night sweats, weight loss, swollen lymph nodes, body aches, joint swelling, muscle aches, chest pain, shortness of breath, mood changes, visual or auditory hallucinations.   Objective:   There were no vitals filed for this visit. General: Well Developed, well nourished, and in no acute distress.  Neuro/Psych: Alert and oriented x3, extra-ocular muscles intact, able to move all 4 extremities, sensation grossly intact. Skin: Warm and dry, no rashes noted.  Respiratory: Not using accessory muscles, speaking in full sentences, trachea midline.  Cardiovascular: Pulses palpable, no extremity edema. Abdomen: Does not appear distended. MSK: ***  Lab and Radiology Results No results found for this or any previous visit (from the past 72 hour(s)). No results found.  Impression and Recommendations:    Assessment and Plan: 54 y.o. male with ***.  PDMP not reviewed this encounter. No orders of the defined types were placed in this encounter.  No orders of the defined types were placed in this encounter.   Discussed warning signs or symptoms. Please see discharge instructions. Patient expresses understanding.   ***

## 2019-12-30 ENCOUNTER — Ambulatory Visit: Payer: BC Managed Care – PPO | Admitting: Family Medicine

## 2019-12-30 ENCOUNTER — Other Ambulatory Visit: Payer: BC Managed Care – PPO

## 2020-01-19 ENCOUNTER — Encounter: Payer: Self-pay | Admitting: Internal Medicine

## 2020-02-21 ENCOUNTER — Other Ambulatory Visit (INDEPENDENT_AMBULATORY_CARE_PROVIDER_SITE_OTHER): Payer: BC Managed Care – PPO

## 2020-02-21 ENCOUNTER — Other Ambulatory Visit: Payer: Self-pay

## 2020-02-21 DIAGNOSIS — R972 Elevated prostate specific antigen [PSA]: Secondary | ICD-10-CM

## 2020-02-21 DIAGNOSIS — R739 Hyperglycemia, unspecified: Secondary | ICD-10-CM | POA: Diagnosis not present

## 2020-02-21 NOTE — Addendum Note (Signed)
Addended by: Manuela Schwartz on: 02/21/2020 03:12 PM   Modules accepted: Orders

## 2020-02-22 LAB — HEMOGLOBIN A1C
Hgb A1c MFr Bld: 5.4 % of total Hgb (ref <5.7)
Mean Plasma Glucose: 108 mg/dL
eAG (mmol/L): 6 mmol/L

## 2020-02-22 LAB — PSA: PSA: 8.33 ng/mL — ABNORMAL HIGH (ref <=4.0)

## 2020-02-24 ENCOUNTER — Encounter: Payer: Self-pay | Admitting: Internal Medicine

## 2020-02-24 NOTE — Addendum Note (Signed)
Addended by: Louie Flenner D on: 12/14/2020 11:02 AM   Modules accepted: Orders  

## 2020-04-27 ENCOUNTER — Ambulatory Visit: Payer: BC Managed Care – PPO | Admitting: Internal Medicine

## 2020-05-18 ENCOUNTER — Other Ambulatory Visit: Payer: Self-pay

## 2020-05-18 ENCOUNTER — Ambulatory Visit: Payer: BC Managed Care – PPO | Admitting: Internal Medicine

## 2020-05-18 ENCOUNTER — Ambulatory Visit (HOSPITAL_BASED_OUTPATIENT_CLINIC_OR_DEPARTMENT_OTHER)
Admission: RE | Admit: 2020-05-18 | Discharge: 2020-05-18 | Disposition: A | Discharge status: Home / Self Care | Payer: BC Managed Care – PPO | Source: Ambulatory Visit | Attending: Internal Medicine | Admitting: Internal Medicine

## 2020-05-18 ENCOUNTER — Encounter: Payer: Self-pay | Admitting: Internal Medicine

## 2020-05-18 VITALS — BP 126/78 | HR 90 | Temp 98.2°F | Resp 16 | Ht 66.0 in | Wt 235.0 lb

## 2020-05-18 DIAGNOSIS — M109 Gout, unspecified: Secondary | ICD-10-CM | POA: Insufficient documentation

## 2020-05-18 DIAGNOSIS — I10 Essential (primary) hypertension: Secondary | ICD-10-CM | POA: Diagnosis not present

## 2020-05-18 MED ORDER — ALLOPURINOL 100 MG PO TABS: 100.0000 mg | ORAL_TABLET | Freq: Every day | ORAL | 0 refills | Status: DC

## 2020-05-18 MED ORDER — COLCHICINE 0.6 MG PO TABS: 0.6000 mg | ORAL_TABLET | Freq: Two times a day (BID) | ORAL | 1 refills | Status: DC | PRN

## 2020-05-18 NOTE — Patient Instructions (Signed)
In a  few days start taking allopurinol 100 mg 1 tablet every day.  This is a gout medication.  For the first 10 days after you start allopurinol take colchicine twice a day every day.  After that you can take colchicine as needed  If this is not making your gout better let me know.  If you have fever, chills or a rash: Stop allopurinol  immediately and let me know    Your blood pressure is very good today, check a blood pressure twice a month BP GOAL is between 110/65 and  135/85. If it is consistently higher or lower, let me know   GO TO THE LAB : Get the blood work     [place], PLEASE SCHEDULE YOUR APPOINTMENTS Come back for  A check up in 3 months    STOP BY THE FIRST FLOOR:  get the XR

## 2020-05-18 NOTE — Progress Notes (Signed)
Subjective:    Patient ID: Justin Elliott, male    DOB: 06/01/1965, 55 y.o.   MRN: [id number]  DOS:  05/18/2020 Type of visit - description: rov Today with talk about hypertension and gout. He has noted more frequent attacks of gout: Typically at the left ankle, described pain and some warmness. Symptoms last about a week. No recent injury. No other joints are affected Denies fever chills. His diet has not changed much.   Review of Systems See above   Past Medical History:  Diagnosis Date  . Elevated serum creatinine   . Gout   . HTN (hypertension) 2009  . Polypoid middle turbinate     Past Surgical History:  Procedure Laterality Date  . FINGER SURGERY Right 1984  . NASAL ENDOSCOPY      Allergies as of 05/18/2020      Reactions   Losartan    Sore throat, no lip-tongue swelling      Medication List       Accurate as of May 18, 2020  1:56 PM. If you have any questions, ask your nurse or doctor.        amLODipine 10 MG tablet Commonly known as: NORVASC Take 1 tablet (10 mg total) by mouth daily.   carvedilol 25 MG tablet Commonly known as: COREG Take 1 tablet (25 mg total) by mouth 2 (two) times daily with a meal.   colchicine 0.6 MG tablet Take 1 tablet (0.6 mg total) by mouth 2 (two) times daily as needed (gout).          Objective:   Physical Exam BP 126/78 (BP Location: Left Arm, Patient Position: Sitting, Cuff Size: Normal)   Pulse 90   Temp 98.2 F (36.8 C) (Oral)   Resp 16   Ht 5\' 6"  (1.676 m)   Wt 235 lb (106.6 kg)   SpO2 98%   BMI 37.93 kg/m  General:   Well developed, NAD, BMI noted. HEENT:  Normocephalic . Face symmetric, atraumatic Lungs:  CTA B Normal respiratory effort, no intercostal retractions, no accessory muscle use. Heart: RRR,  no murmur.  MSK: Right ankle normal Left ankle: Minimal swelling, range of motion normal, slightly TTP throughout.  No redness. Normal left pedal pulse Skin: Not pale. Not  jaundice Neurologic:  alert & oriented X3.  Speech normal, gait appropriate for age and unassisted Psych--  Cognition and judgment appear intact.  Cooperative with normal attention span and concentration.  Behavior appropriate. No anxious or depressed appearing.      Assessment     Assessment: HTN Gout: DX 08-2016 Slightly increased creatinine:  Renal US 2017 wnl, microalbumin (-). Likely d/t large muscle mass. Saw nephrology 11/2016, they rec good BP control, treat elevated cholesterol if needed, weight loss, avoid NSAIDs, keep hydrated, consider treatment w/ uric acid lowering meds  PLAN  HTN: BP is very good today, reports good ambulatory BPs, continue amlodipine and carvedilol Gout: Currently on colchicine only, evidently he is having more frequent attacks located at the left ankle.  Exam did show some swelling of the area. Plan: CMP, uric acid, x-ray left ankle. Start allopurinol 100 mg daily.  He is aware that medication may trigger gout episode thus rec  colchicine twice daily for 10 days and then as needed Watch for rashes, fever or chills. Increased PSA: Has a prostate biopsy schedule in 2 days, recommend to start allopurinol after the biopsy. RTC 3 months    This visit occurred during the SARS-CoV-2 public  health emergency.  Safety protocols were in place, including screening questions prior to the visit, additional usage of staff PPE, and extensive cleaning of exam room while observing appropriate contact time as indicated for disinfecting solutions.

## 2020-05-19 LAB — COMPREHENSIVE METABOLIC PANEL
ALT: 22 U/L (ref 0–53)
AST: 18 U/L (ref 0–37)
Albumin: 3.9 g/dL (ref 3.5–5.2)
Alkaline Phosphatase: 67 U/L (ref 39–117)
BUN: 14 mg/dL (ref 6–23)
CO2: 27 mEq/L (ref 19–32)
Calcium: 9.5 mg/dL (ref 8.4–10.5)
Chloride: 106 mEq/L (ref 96–112)
Creatinine, Ser: 1.55 mg/dL — ABNORMAL HIGH (ref 0.40–1.50)
GFR: 50.11 mL/min — ABNORMAL LOW (ref >60.00)
Glucose, Bld: 88 mg/dL (ref 70–99)
Potassium: 4.1 mEq/L (ref 3.5–5.1)
Sodium: 142 mEq/L (ref 135–145)
Total Bilirubin: 0.4 mg/dL (ref 0.2–1.2)
Total Protein: 7.1 g/dL (ref 6.0–8.3)

## 2020-05-19 LAB — URIC ACID: Uric Acid, Serum: 10 mg/dL — ABNORMAL HIGH (ref 4.0–7.8)

## 2020-05-19 NOTE — Assessment & Plan Note (Signed)
HTN: BP is very good today, reports good ambulatory BPs, continue amlodipine and carvedilol Gout: Currently on colchicine only, evidently he is having more frequent attacks located at the left ankle.  Exam did show some swelling of the area. Plan: CMP, uric acid, x-ray left ankle. Start allopurinol 100 mg daily.  He is aware that medication may trigger gout episode thus rec  colchicine twice daily for 10 days and then as needed Watch for rashes, fever or chills. Increased PSA: Has a prostate biopsy schedule in 2 days, recommend to start allopurinol after the biopsy. RTC 3 months

## 2020-05-20 HISTORY — PX: PROSTATE BIOPSY: SHX241

## 2020-05-26 ENCOUNTER — Encounter: Payer: Self-pay | Admitting: Internal Medicine

## 2020-05-26 DIAGNOSIS — C61 Malignant neoplasm of prostate: Secondary | ICD-10-CM

## 2020-05-27 ENCOUNTER — Other Ambulatory Visit (HOSPITAL_COMMUNITY): Payer: Self-pay | Admitting: Urology

## 2020-05-27 ENCOUNTER — Encounter: Payer: Self-pay | Admitting: Internal Medicine

## 2020-05-27 ENCOUNTER — Other Ambulatory Visit: Payer: Self-pay | Admitting: Urology

## 2020-05-27 DIAGNOSIS — C61 Malignant neoplasm of prostate: Secondary | ICD-10-CM

## 2020-06-02 ENCOUNTER — Other Ambulatory Visit: Payer: Self-pay

## 2020-06-02 ENCOUNTER — Encounter (HOSPITAL_COMMUNITY)
Admission: RE | Admit: 2020-06-02 | Discharge: 2020-06-02 | Disposition: A | Discharge status: Home / Self Care | Payer: BC Managed Care – PPO | Source: Ambulatory Visit | Attending: Urology | Admitting: Urology

## 2020-06-02 DIAGNOSIS — C61 Malignant neoplasm of prostate: Secondary | ICD-10-CM | POA: Insufficient documentation

## 2020-06-02 MED ORDER — TECHNETIUM TC 99M MEDRONATE IV KIT
20.0000 | PACK | Freq: Once | INTRAVENOUS | Status: AC | PRN
  Administered 2020-06-02: 22 via INTRAVENOUS

## 2020-06-04 DIAGNOSIS — C61 Malignant neoplasm of prostate: Secondary | ICD-10-CM | POA: Insufficient documentation

## 2020-06-12 ENCOUNTER — Encounter: Payer: Self-pay | Admitting: Medical Oncology

## 2020-06-12 NOTE — Progress Notes (Signed)
Left message requesting a return call to discuss referral to [facility] 5/13.

## 2020-06-13 ENCOUNTER — Other Ambulatory Visit: Payer: Self-pay | Admitting: Internal Medicine

## 2020-06-16 ENCOUNTER — Encounter: Payer: Self-pay | Admitting: Medical Oncology

## 2020-06-18 ENCOUNTER — Encounter: Payer: Self-pay | Admitting: Medical Oncology

## 2020-06-18 NOTE — Progress Notes (Signed)
Spoke with patient to confirm appointment for Justin Elliott 5/13, arriving @ 8 am. I reviewed location, valet parking, registration and COVID protocols. I reminded him to bring his completed medical forms. He voiced understanding.

## 2020-06-18 NOTE — Progress Notes (Signed)
GU Location of Tumor / Histology: prostatic adenocarcinoma  If Prostate Cancer, Gleason Score is (4 + 5) and PSA is (8.33). Prostate volume: 37 grams.   Alice Reichert was referred by his PCP, Dr. Larose Kells, to Dr. Jeffie Pollock for further evaluation of an elevated PSA of 8.33 in 02/21/2020  09/20/19 psa 5.84 07/10/17  psa 1.97 06/16/15  psa 1.74 03/07/14 psa 2.69  Biopsies of prostate (if applicable) revealed:  Past/Anticipated interventions by urology, if any: prostate biopsy, ct abd/pelvis (negative) and bone scan (negative)  Past/Anticipated interventions by medical oncology, if any: no  Weight changes, if any: no  Bowel/Bladder complaints, if any: IPSS 8. SHIM 20. Denies dysuria, hematuria, urinary leakage or incontinence. Denies any bowel complaints.    Nausea/Vomiting, if any: no  Pain issues, if any:  denies  SAFETY ISSUES:  Prior radiation? denies  Pacemaker/ICD? denies  Possible current pregnancy? no, male patient  Is the patient on methotrexate? no  Current Complaints / other details:  55 year old male. Married with 2 children. Resides in [place].  BP elevated. Patient's father has a hx of prostate cancer.

## 2020-06-19 ENCOUNTER — Other Ambulatory Visit: Payer: Self-pay | Admitting: Medical Oncology

## 2020-06-19 ENCOUNTER — Encounter: Payer: Self-pay | Admitting: Medical Oncology

## 2020-06-19 ENCOUNTER — Inpatient Hospital Stay: Payer: BC Managed Care – PPO | Attending: Oncology | Admitting: Oncology

## 2020-06-19 ENCOUNTER — Other Ambulatory Visit: Payer: Self-pay

## 2020-06-19 ENCOUNTER — Ambulatory Visit
Admission: RE | Admit: 2020-06-19 | Discharge: 2020-06-19 | Disposition: A | Discharge status: Home / Self Care | Payer: BC Managed Care – PPO | Source: Ambulatory Visit | Attending: Radiation Oncology | Admitting: Radiation Oncology

## 2020-06-19 ENCOUNTER — Encounter: Payer: Self-pay | Admitting: General Practice

## 2020-06-19 ENCOUNTER — Encounter: Payer: Self-pay | Admitting: Radiation Oncology

## 2020-06-19 VITALS — BP 147/101 | HR 75 | Temp 97.1°F | Resp 18 | Ht 71.5 in | Wt 232.8 lb

## 2020-06-19 DIAGNOSIS — I10 Essential (primary) hypertension: Secondary | ICD-10-CM | POA: Diagnosis not present

## 2020-06-19 DIAGNOSIS — Z8042 Family history of malignant neoplasm of prostate: Secondary | ICD-10-CM | POA: Diagnosis not present

## 2020-06-19 DIAGNOSIS — N200 Calculus of kidney: Secondary | ICD-10-CM | POA: Insufficient documentation

## 2020-06-19 DIAGNOSIS — I700 Atherosclerosis of aorta: Secondary | ICD-10-CM | POA: Insufficient documentation

## 2020-06-19 DIAGNOSIS — Z803 Family history of malignant neoplasm of breast: Secondary | ICD-10-CM | POA: Insufficient documentation

## 2020-06-19 DIAGNOSIS — R918 Other nonspecific abnormal finding of lung field: Secondary | ICD-10-CM | POA: Diagnosis not present

## 2020-06-19 DIAGNOSIS — C61 Malignant neoplasm of prostate: Secondary | ICD-10-CM | POA: Insufficient documentation

## 2020-06-19 DIAGNOSIS — M109 Gout, unspecified: Secondary | ICD-10-CM | POA: Diagnosis not present

## 2020-06-19 DIAGNOSIS — M129 Arthropathy, unspecified: Secondary | ICD-10-CM | POA: Insufficient documentation

## 2020-06-19 NOTE — Progress Notes (Signed)
Reason for the request:    Prostate cancer  HPI: I was asked by Dr. Jeffie Pollock to evaluate [name] for the evaluation of prostate cancer.  He is a 55 year old man without any significant comorbid conditions who was found to have an elevated PSA of 8.3 in 08-Mar-2020.  Based on these findings he was evaluated by Dr. Aris Lot and underwent a prostate biopsy on May 20, 2020.  The biopsy showed a Gleason score 4+5 = 9 and at least 5 cores involving bilateral lobes of the prostate.  He has also a Gleason score 6 and Gleason score 7 cores.  His staging scans including a CT scan and a bone scan showed no evidence of metastatic disease at the time.  Clinically, he reports feeling well without any major complaints.  He denies any frequency urgency or hematuria.  He does report mild nocturia.  He remains active and continues to attend to activities of daily living.  He does have a family history of prostate cancer in his father related in his 34s.  His father developed breast cancer in his 16s although the details of that are unclear.  He also has a sister with breast cancer.  He does not report any headaches, blurry vision, syncope or seizures. Does not report any fevers, chills or sweats.  Does not report any cough, wheezing or hemoptysis.  Does not report any chest pain, palpitation, orthopnea or leg edema.  Does not report any nausea, vomiting or abdominal pain.  Does not report any constipation or diarrhea.  Does not report any skeletal complaints.    Does not report frequency, urgency or hematuria.  Does not report any skin rashes or lesions. Does not report any heat or cold intolerance.  Does not report any lymphadenopathy or petechiae.  Does not report any anxiety or depression.  Remaining review of systems is negative.    Past Medical History:  Diagnosis Date  . Elevated serum creatinine   . Gout   . HTN (hypertension) 2009  . Polypoid middle turbinate   . Prostate cancer [facility])   :  Past Surgical  History:  Procedure Laterality Date  . FINGER SURGERY Right 1984  . NASAL ENDOSCOPY    . PROSTATE BIOPSY  05/20/2020   +  :   Current Outpatient Medications:  .  allopurinol (ZYLOPRIM) 100 MG tablet, Take 1 tablet (100 mg total) by mouth daily. (Patient not taking: Reported on 06/19/2020), Disp: 90 tablet, Rfl: 0 .  amLODipine (NORVASC) 10 MG tablet, Take 1 tablet (10 mg total) by mouth daily., Disp: 90 tablet, Rfl: 1 .  carvedilol (COREG) 25 MG tablet, Take 1 tablet (25 mg total) by mouth 2 (two) times daily with a meal., Disp: 180 tablet, Rfl: 1 .  colchicine 0.6 MG tablet, Take 1 tablet (0.6 mg total) by mouth 2 (two) times daily as needed (gout). (Patient not taking: Reported on 06/19/2020), Disp: 60 tablet, Rfl: 1:  Allergies  Allergen Reactions  . Losartan     Sore throat, no lip-tongue swelling  :  Family History  Problem Relation Age of Onset  . Prostate cancer Father 34       died 2015-03-09 age 52  . Breast cancer Sister   . Diabetes type I Son   . Stroke Mother 44  . Colon cancer Neg Hx   . CAD Neg Hx   :  Social History   Socioeconomic History  . Marital status: Married    Spouse name: Gurtaj Ruz  .  Number of children: 2  . Years of education: Not on file  . Highest education level: Not on file  Occupational History  . 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 Smoking status: Never Smoker  . Smokeless tobacco: Never Used  Vaping Use  . Vaping Use: Never used  Substance and Sexual Activity  . Alcohol use: No    Alcohol/week: 0.0 standard drinks  . Drug use: No  . Sexual activity: Yes  Other Topics Concern  . Not on file  Social History Narrative   Household: wife      D 2003    S 1998 (graduated , college)   PHD 2017   2 children: Ysidro Evert and [employer]   Social Determinants of Health   Financial Resource Strain: Not on file  Food Insecurity: Not on file  Transportation Needs: Not on file  Physical Activity: Not on file   Stress: Not on file  Social Connections: Not on file  Intimate Partner Violence: Not on file  :  Pertinent items are noted in HPI.  Exam: ECOG 0 General appearance: alert and cooperative appeared without distress. Head: atraumatic without any abnormalities. Eyes: conjunctivae/corneas clear. PERRL.  Sclera anicteric. Throat: lips, mucosa, and tongue normal; without oral thrush or ulcers. Resp: clear to auscultation bilaterally without rhonchi, wheezes or dullness to percussion. Cardio: regular rate and rhythm, S1, S2 normal, no murmur, click, rub or gallop GI: soft, non-tender; bowel sounds normal; no masses,  no organomegaly Skin: Skin color, texture, turgor normal. No rashes or lesions Lymph nodes: Cervical, supraclavicular, and axillary nodes normal. Neurologic: Grossly normal without any motor, sensory or deep tendon reflexes. Musculoskeletal: No joint deformity or effusion.    NM Bone Scan Whole Body  Result Date: 06/03/2020 CLINICAL DATA:  Prostate cancer.  Staging EXAM: NUCLEAR MEDICINE WHOLE BODY BONE SCAN TECHNIQUE: Whole body anterior and posterior images were obtained approximately 3 hours after intravenous injection of radiopharmaceutical. RADIOPHARMACEUTICALS:  22.0 mCi Technetium-66mMDP IV COMPARISON:  None. FINDINGS: No abnormal foci of increased uptake to suggest osseous metastasis. Degenerative changes are noted within the left foot, lateral compartment of the right knee and the sternoclavicular joints. Normal physiologic tracer activity seen within the kidneys an urinary bladder. IMPRESSION: No findings to suggest osseous metastasis. Electronically Signed   By: TKerby MoorsM.D.   On: 06/03/2020 11:07    Assessment and Plan:    54year old man with:  1.  Prostate cancer diagnosed in April 2022.  He was found to have a Gleason score of 4+5 = 9 and a PSA of 8.3.  His staging scans did not show any evidence of metastatic disease.  Is the case was discussed today  in the prostate cancer multidisciplinary clinic including review of his pathology results as well as review of his imaging studies with radiology.  He has high risk prostate cancer and given his young age and aggressive measures are warranted.  Treatment options include primary surgical therapy versus radiation on long-term androgen deprivation therapy were discussed.  Risks and benefits of both approaches were discussed and the role for trial modality therapy in the setting of upfront surgical therapy was discussed.  He is favoring this approach at this time and he understands he might require additional therapy after surgery.   2.  Genetic counseling considerations: He has significant family history not only with prostate cancer also male breast cancer and male breast cancer.  Analyzing for homologous recombination repair germline genes mutation would be helpful  not only for future therapeutic purposes but also for family history in future if his children recommendations.  He is considering that option and let us know in the near future.  All his questions were answered to his satisfaction.   60  minutes were dedicated to this visit. The time was spent on reviewing laboratory data, imaging studies, discussing treatment options, and answering questions regarding future plan.     A copy of this consult has been forwarded to the requesting physician.

## 2020-06-19 NOTE — Progress Notes (Signed)
Radiation Oncology         [phone] ________________________________  Multidisciplinary Prostate Cancer Clinic  Initial Radiation Oncology Consultation  Name: Justin Elliott MRN: [id number]  Date: 06/19/2020  DOB: Aug 11, 1965  CC:Paz, Alda Berthold, MD  Irine Seal, MD   REFERRING PHYSICIAN: Irine Seal, MD  DIAGNOSIS: 55 y.o. gentleman with stage T1c adenocarcinoma of the prostate with a Gleason's score of 4+5 and a PSA of 8.33    ICD-10-CM   1. Malignant neoplasm of prostate ([place])  C61   2. Prostate cancer ([place])  C61     HISTORY OF PRESENT ILLNESS::Justin Elliott is a 55 y.o. gentleman.  He was noted to have an elevated PSA of 8.33 on 02/21/20, by his primary care physician, Dr. Larose Kells.  Accordingly, he was referred for evaluation in urology by Dr. Jeffie Pollock on 03/30/20,  digital rectal examination was performed at that time revealing no nodules.  The patient proceeded to transrectal ultrasound with 12 biopsies of the prostate on 05/20/20.  The prostate volume measured 37 cc.  Out of 12 core biopsies, 7 were positive.  The maximum Gleason score was 4+5, and this was seen in the left base lateral, left base, left mid, right base (with perineural invasion), and right base lateral (with PNI). Additionally, Gleason 3+4 was seen in the right apex lateral, and Gleason 3+3 in the left apex.      He underwent staging bone scan on 06/02/20, which was negative for osseous metastatic disease. He also underwent CT A/P on 06/05/20 showing no evidence of lymphadenopathy or visceral metastatic disease. Of note, there were several nonspecific right lower lobe pulmonary nodules that will need to be monitored on follow up imaging.  The patient reviewed the biopsy results with his urologist and he has kindly been referred today to the multidisciplinary prostate cancer clinic for presentation of pathology and radiology studies in our conference for discussion of potential radiation treatment options and clinical  evaluation.    PREVIOUS RADIATION THERAPY: No  PAST MEDICAL HISTORY:  has a past medical history of Elevated serum creatinine, Gout, HTN (hypertension) (2009), Polypoid middle turbinate, and Prostate cancer ([place]).    PAST SURGICAL HISTORY: Past Surgical History:  Procedure Laterality Date  . FINGER SURGERY Right 1984  . NASAL ENDOSCOPY    . PROSTATE BIOPSY  05/20/2020   +    FAMILY HISTORY: family history includes Breast cancer in his sister; Diabetes type I in his son; Prostate cancer (age of onset: 68) in his father; Stroke (age of onset: 8) in his mother.  SOCIAL HISTORY:  reports that he has never smoked. He has never used smokeless tobacco. He reports that he does not drink alcohol and does not use drugs.  ALLERGIES: Losartan  MEDICATIONS:  Current Outpatient Medications  Medication Sig Dispense Refill  . amLODipine (NORVASC) 10 MG tablet Take 1 tablet (10 mg total) by mouth daily. 90 tablet 1  . carvedilol (COREG) 25 MG tablet Take 1 tablet (25 mg total) by mouth 2 (two) times daily with a meal. 180 tablet 1  . allopurinol (ZYLOPRIM) 100 MG tablet Take 1 tablet (100 mg total) by mouth daily. (Patient not taking: Reported on 06/19/2020) 90 tablet 0  . colchicine 0.6 MG tablet Take 1 tablet (0.6 mg total) by mouth 2 (two) times daily as needed (gout). (Patient not taking: Reported on 06/19/2020) 60 tablet 1   No current facility-administered medications for this encounter.    REVIEW OF SYSTEMS:  On review of  systems, the patient reports that he is doing well overall. He denies any chest pain, shortness of breath, cough, fevers, chills, night sweats, unintended weight changes. He denies any bowel disturbances, and denies abdominal pain, nausea or vomiting. He denies any new musculoskeletal or joint aches or pains. His IPSS was 8, indicating mild urinary symptoms. His SHIM was 20, indicating he has mild erectile dysfunction. A complete review of systems is obtained and is otherwise  negative.   PHYSICAL EXAM:  Wt Readings from Last 3 Encounters:  06/19/20 232 lb 12.8 oz (105.6 kg)  05/18/20 235 lb (106.6 kg)  10/28/19 232 lb (105.2 kg)   Temp Readings from Last 3 Encounters:  06/19/20 (!) 97.1 F (36.2 C)  05/18/20 98.2 F (36.8 C) (Oral)  10/28/19 98.3 F (36.8 C) (Oral)   BP Readings from Last 3 Encounters:  06/19/20 (!) 147/101  05/18/20 126/78  10/28/19 (!) 143/94   Pulse Readings from Last 3 Encounters:  06/19/20 75  05/18/20 90  10/28/19 82   Pain Assessment Pain Score: 0-No pain/10  In general this is a well appearing African American male in no acute distress. He's alert and oriented x4 and appropriate throughout the examination. Cardiopulmonary assessment is negative for acute distress and he exhibits normal effort.    KPS = 100  100 - Normal; no complaints; no evidence of disease. 90   - Able to carry on normal activity; minor signs or symptoms of disease. 80   - Normal activity with effort; some signs or symptoms of disease. 64   - Cares for self; unable to carry on normal activity or to do active work. 60   - Requires occasional assistance, but is able to care for most of his personal needs. 50   - Requires considerable assistance and frequent medical care. 64   - Disabled; requires special care and assistance. 77   - Severely disabled; hospital admission is indicated although death not imminent. 20   - Very sick; hospital admission necessary; active supportive treatment necessary. 10   - Moribund; fatal processes progressing rapidly. 0     - Dead  Karnofsky DA, Abelmann [place], Craver LS and Burchenal [facility] [phone]) The use of the nitrogen mustards in the palliative treatment of carcinoma: with particular reference to bronchogenic carcinoma Cancer 1 [id number]   LABORATORY DATA:  Lab Results  Component Value Date   WBC 4.1 09/20/2019   HGB 14.0 09/20/2019   HCT 42.1 09/20/2019   MCV 84.5 09/20/2019   PLT 232.0 09/20/2019   Lab Results   Component Value Date   NA 142 05/18/2020   K 4.1 05/18/2020   CL 106 05/18/2020   CO2 27 05/18/2020   Lab Results  Component Value Date   ALT 22 05/18/2020   AST 18 05/18/2020   ALKPHOS 67 05/18/2020   BILITOT 0.4 05/18/2020     RADIOGRAPHY: NM Bone Scan Whole Body  Result Date: 06/03/2020 CLINICAL DATA:  Prostate cancer.  Staging EXAM: NUCLEAR MEDICINE WHOLE BODY BONE SCAN TECHNIQUE: Whole body anterior and posterior images were obtained approximately 3 hours after intravenous injection of radiopharmaceutical. RADIOPHARMACEUTICALS:  22.0 mCi Technetium-72m MDP IV COMPARISON:  None. FINDINGS: No abnormal foci of increased uptake to suggest osseous metastasis. Degenerative changes are noted within the left foot, lateral compartment of the right knee and the sternoclavicular joints. Normal physiologic tracer activity seen within the kidneys an urinary bladder. IMPRESSION: No findings to suggest osseous metastasis. Electronically Signed   By: Kerby Moors  M.D.   On: 06/03/2020 11:07      IMPRESSION/PLAN: 55 y.o. gentleman with Stage T1c adenocarcinoma of the prostate with a Gleason score of 4+5 and a PSA of 8.33.    We discussed the patient's workup and outlined the nature of prostate cancer in this setting. The patient's T stage, Gleason's score, and PSA put him into the high risk group. Accordingly, he is eligible for a variety of potential treatment options including prostatectomy or LT- ADT in combination with either 8 weeks of external radiation or5 weeks of external radiation with an upfront brachytherapy boost. He is also a candidate for the PROTEUS trial, which would involve 6 months of neoadjuvant ADT, followed by prostatectomy, and then 6 more months of ADT. We discussed the available radiation techniques, and focused on the details and logistics of delivery. We discussed and outlined the risks, benefits, short and long-term effects associated with radiotherapy and compared and  contrasted these with prostatectomy. We discussed the role of SpaceOAR gel in reducing the rectal toxicity associated with radiotherapy. We also detailed the role of ADT in the treatment of high risk prostate cancer and outlined the associated side effects that could be expected with this therapy. He appears to have a good understanding of his disease and our treatment recommendations which are of curative intent.  He was encouraged to ask questions that were answered to his stated satisfaction.   At the end of the conversation, the patient remains undecided but will discuss surgical options further with Dr. Alinda Money today in clinic as well. He will take time to consider his options and plans to reach out to one of [place] with his final decision in the near future. We enjoyed meeting him today and look forward to following along, and potentially participating, in his care. He knows that he is welcome to call with any questions or concerns regarding radiation.  We spent 60 min on this encounter including chart/film review, face-to-face and documentation.     Nicholos Johns, PA-C    Tyler Pita, MD  [facility] Direct Dial: [phone]  Fax: [phone] [email or web address]  Skype  [email or web address]   This document serves as a record of services personally performed by Tyler Pita, MD and Freeman Caldron, PA-C. It was created on their behalf by Wilburn Mylar, a trained medical scribe. The creation of this record is based on the scribe's personal observations and the provider's statements to them. This document has been checked and approved by the attending provider.

## 2020-06-19 NOTE — Progress Notes (Signed)
[place] Psychosocial Distress Screening Spiritual Care  Met with Justin Elliott and his wife Justin Elliott in [facility] to introduce [facility] team/resources, reviewing distress screen per protocol.  The patient scored a 3 on the Psychosocial Distress Thermometer which indicates mild distress. Also assessed for distress and other psychosocial needs.   ONCBCN DISTRESS SCREENING 06/19/2020  Screening Type Initial Screening  Distress experienced in past week (1-10) 3  Referral to support programs Yes   The Simmonses note that they appreciate all the information and opportunity to meet team all at once in [facility], which has helped their comfort level and confidence.  Follow up needed: No. They report no other needs at this time, but are aware of ongoing [place] availability and know to contact Support Team as needed/desired.   [place], [place], [facility] Pager [phone] Voicemail [phone]

## 2020-06-19 NOTE — Consult Note (Signed)
[facility]     06/19/2020   --------------------------------------------------------------------------------   Sonia Side L. Winstanley  MRN: [id number]  DOB: [birth year], 55 year old Male  SSN:    PRIMARY CARE:  Kathlene November, MD  REFERRING:  Kathlene November, MD  PROVIDER:  Irine Seal, M.D.  TREATING:  Raynelle Bring, M.D.  LOCATION:  [facility] [phone]     --------------------------------------------------------------------------------   CC/HPI: CC: Prostate Cancer   Physician requesting consult: Dr. Irine Seal  PCP: Dr. Kathlene November  Location of consult: [facility] - Prostate Cancer [facility]idisciplinary [facility]   Mr. Rabine is a 55 year old gentleman with a past medical history significant for hypertension, gout, hyperlipidemia, and arthritis. He was found to have an elevated PSA of 8.33 prompting an evaluation and TRUS biopsy of the prostate on 05/20/20 that confirmed Gleason 4+5=9 adenocarcinoma of the prostate with 7 out of 12 biopsy cores positive for malignancy.   Family history: He has a father with a history of prostate cancer and breast cancer and a sister with a history of breast cancer. His father died at age 33 and was on what sounds like androgen deprivation when he died but did not die of prostate cancer. His sister did die of metastatic breast cancer in her 36s.   Imaging studies:  Bone scan (06/02/20): Negative for metastatic disease.  CT abdomen/pelvis (06/05/20): Negative for metastatic disease. Small right lower lobe pulmonary nodules.   PMH: He has a history of gout, hypertension, hyperlipidemia, and arthritis.  PSH: No abdominal surgeries.   TNM stage: cT1c N0 M0  PSA: 8.33  Gleason score: 4+5=9 (GG 5)  Biopsy (05/20/20): 7/12 cores positive  Left: L apex (10%, 3+3=6), L mid (60%, 4+5=9), L lateral base (90%, 4+5=9), L base (60%, 4+5=9)  Right: R lateral apex (60%, 3+4=7), R base (90%, 4+5=9, PNI), R lateral base (80%, 4+5=9)   Prostate volume: 37.0 cc   Nomogram  OC disease: 17%  EPE: 80%  SVI: 35%  LNI: 30%  PFS (5 year, 10 year): 42%, 28%   Urinary function: IPSS is 8.  Erectile function: SHIM score is 20.     ALLERGIES: No Allergies    MEDICATIONS: Amlodipine Besylate 10 mg tablet  Carvedilol 25 mg tablet  Colchicine 0.6 mg tablet  Levofloxacin 750 mg tablet 1 po 1 hour prior to the procedure     GU PSH: Locm 300-399Mg /Ml Iodine,1Ml - 06/05/2020 Prostate Needle Biopsy - 05/20/2020       PSH Notes: finger surgery-1984   NON-GU PSH: Surgical Pathology, Gross And Microscopic Examination For Prostate Needle - 05/20/2020     GU PMH: Elevated PSA - 06/05/2020, - 05/20/2020, - 03/30/2020 BPH w/o LUTS - 03/30/2020    NON-GU PMH: Arthritis Gout Hypercholesterolemia Hypertension    FAMILY HISTORY: Breast Cancer - Sister heart failure - Father Prostate Cancer - Father    Notes: 1 son; 1 daughter    SOCIAL HISTORY: Marital Status: Married Preferred Language: English; Ethnicity: Not Hispanic Or Latino; Race: Black or African American Current Smoking Status: Patient has never smoked.   Tobacco Use Assessment Completed: Used Tobacco in last 30 days? Does not use smokeless tobacco. Has never drank.  Drinks 2 caffeinated drinks per day. Patient's occupation Tour manager.    REVIEW OF SYSTEMS:    GU Review Male:   Patient denies frequent urination, hard to postpone urination, burning/ pain with urination, get up at night to urinate, leakage of urine, stream starts and  stops, trouble starting your streams, and have to strain to urinate .  Gastrointestinal (Lower):   Patient denies diarrhea and constipation.  Gastrointestinal (Upper):   Patient denies nausea and vomiting.  Constitutional:   Patient denies fever, night sweats, weight loss, and fatigue.  Skin:   Patient denies skin rash/ lesion and itching.  Eyes:   Patient denies double vision and blurred vision.  Ears/ Nose/ Throat:   Patient  denies sore throat and sinus problems.  Hematologic/Lymphatic:   Patient denies swollen glands and easy bruising.  Cardiovascular:   Patient denies leg swelling and chest pains.  Respiratory:   Patient denies cough and shortness of breath.  Endocrine:   Patient denies excessive thirst.  Musculoskeletal:   Patient denies back pain and joint pain.  Neurological:   Patient denies headaches and dizziness.  Psychologic:   Patient denies depression and anxiety.   VITAL SIGNS: None   GU PHYSICAL EXAMINATION:    Prostate: Prostate about 40 grams. Left lobe normal consistency, right lobe normal consistency. Symmetrical lobes. No prostate nodule. Left lobe no tenderness, right lobe no tenderness.    MULTI-SYSTEM PHYSICAL EXAMINATION:    Constitutional: Well-nourished. No physical deformities. Normally developed. Good grooming.  Neck: Neck symmetrical, not swollen. Normal tracheal position.  Respiratory: No labored breathing, no use of accessory muscles. Clear bilaterally.  Cardiovascular: Normal temperature, normal extremity pulses, no swelling, no varicosities. Regular rate and rhythm.  Lymphatic: No enlargement of neck, axillae, groin.  Skin: No paleness, no jaundice, no cyanosis. No lesion, no ulcer, no rash.  Neurologic / Psychiatric: Oriented to time, oriented to place, oriented to person. No depression, no anxiety, no agitation.  Gastrointestinal: No mass, no tenderness, no rigidity, non obese abdomen.  Eyes: Normal conjunctivae. Normal eyelids.  Ears, Nose, Mouth, and Throat: Left ear no scars, no lesions, no masses. Right ear no scars, no lesions, no masses. Nose no scars, no lesions, no masses. Normal hearing. Normal lips.  Musculoskeletal: Normal gait and station of head and neck.     Complexity of Data:  Lab Test Review:   PSA  Records Review:   Pathology Reports, Previous Patient Records  X-Ray Review: C.T. Abdomen/Pelvis: Reviewed Films.  Bone Scan: Reviewed Films.     02/21/20  09/20/19 07/10/17 06/16/15 03/07/14  PSA  Total PSA 8.33 ng/ml 5.84 ng/ml 1.97 ng/ml 1.74 ng/ml 2.69 ng/ml   Notes:                     CLINICAL DATA: History of prostate cancer.   EXAM:  CT ABDOMEN AND PELVIS WITH CONTRAST   TECHNIQUE:  Multidetector CT imaging of the abdomen and pelvis was performed  using the standard protocol following bolus administration of  intravenous contrast.   CONTRAST: 100 mL Omnipaque 300   COMPARISON: None   FINDINGS:  Lower chest: Incidental imaging of the lung bases with basilar  atelectasis. No effusion. No consolidation. RIGHT lower lobe  pulmonary nodule, 4 mm image 17/2.   RIGHT lower lobe pulmonary nodule image 16/2 4 mm.   Hepatobiliary: No focal, suspicious hepatic lesion. Portal vein is  patent. No pericholecystic stranding no biliary duct dilation.   Pancreas: Normal, without mass, inflammation or ductal dilatation.   Spleen: Normal spleen.   Adrenals/Urinary Tract: Adrenal glands normal.   Moderate bilateral renal cortical scarring. Nephrolithiasis on the  RIGHT with 3 moderate-sized calculi in the RIGHT kidney measuring  5-6 mm 1 in the upper pole and 2 in the interpolar RIGHT  kidney. No  hydronephrosis. No perinephric stranding. Urinary bladder with  smooth contour but under distended limiting assessment.   No LEFT-sided nephrolithiasis. No suspicious renal lesion.   Stomach/Bowel: Mildly patulous distal esophagus. Normal appendix. No  acute gastrointestinal process.   Vascular/Lymphatic: Calcified and noncalcified atheromatous plaque  of the abdominal aorta. No aneurysmal dilation. Smooth contour of  the IVC. There is no gastrohepatic or hepatoduodenal ligament  lymphadenopathy. No retroperitoneal or mesenteric lymphadenopathy.   No pelvic sidewall lymphadenopathy.   Reproductive: Mildly heterogeneous prostate is a nonspecific finding  in this patient with history of prostate cancer.   Other: No ascites. Small fat  containing umbilical hernia.   Musculoskeletal: Spinal degenerative changes. No acute or  destructive bone process. Small sclerotic focus in the LEFT proximal  femur favored to represent bone island   IMPRESSION:  1. No evidence of metastatic disease in the abdomen or pelvis.  2. RIGHT lower lobe pulmonary nodules which are nonspecific.  Consider short interval follow-up in this patient with history of  prostate cancer, at 3 months.  3. Aortic atherosclerosis.   Aortic Atherosclerosis (ICD10-I70.0).    Electronically Signed  By: Zetta Bills M.D.  On: 06/05/2020 17:41   CLINICAL DATA: Prostate cancer. Staging   EXAM:  NUCLEAR MEDICINE WHOLE BODY BONE SCAN   TECHNIQUE:  Whole body anterior and posterior images were obtained approximately  3 hours after intravenous injection of radiopharmaceutical.   RADIOPHARMACEUTICALS: 22.0 mCi Technetium-26m MDP IV   COMPARISON: None.   FINDINGS:  No abnormal foci of increased uptake to suggest osseous metastasis.  Degenerative changes are noted within the left foot, lateral  compartment of the right knee and the sternoclavicular joints.  Normal physiologic tracer activity seen within the kidneys an  urinary bladder.   IMPRESSION:  No findings to suggest osseous metastasis.    Electronically Signed  By: Kerby Moors M.D.  On: 06/03/2020 11:07   PROCEDURES: None   ASSESSMENT:      ICD-10 Details  1 GU:   Prostate Cancer - C61    PLAN:           Orders X-Rays: MRI Prostate GSORAD With and Without I.V. Contrast - Please schedule about 1 week prior to surgery date (please communicate with Coni as she will be working on scheduling surgery but MRI needs to be done prior to surgery even if just a few days before.) Thanks!          Schedule         Document Letter(s):  Created for Patient: Clinical Summary         Notes:   1. High-risk prostate cancer: I had a detailed discussion with Mr. Persing and his wife today  regarding his prostate cancer situation. Taking into account his young age and long life expectancy as well as the high risk disease parameters that he has, it was recommended that he proceed with aggressive curative therapy.   The patient was counseled about the natural history of prostate cancer and the standard treatment options that are available for prostate cancer. It was explained to him how his age and life expectancy, clinical stage, Gleason score, and PSA affect his prognosis, the decision to proceed with additional staging studies, as well as how that information influences recommended treatment strategies. We discussed the roles for active surveillance, radiation therapy, surgical therapy, androgen deprivation, as well as ablative therapy options for the treatment of prostate cancer as appropriate to his individual cancer situation. We discussed the risks  and benefits of these options with regard to their impact on cancer control and also in terms of potential adverse events, complications, and impact on quality of life particularly related to urinary and sexual function. The patient was encouraged to ask questions throughout the discussion today and all questions were answered to his stated satisfaction. In addition, the patient was provided with and/or directed to appropriate resources and literature for further education about prostate cancer and treatment options. We discussed surgical therapy for prostate cancer including the different available surgical approaches. We discussed, in detail, the risks and expectations of surgery with regard to cancer control, urinary control, and erectile function as well as the expected postoperative recovery process. Additional risks of surgery including but not limited to bleeding, infection, hernia formation, nerve damage, lymphocele formation, bowel/rectal injury potentially necessitating colostomy, damage to the urinary tract resulting in urine leakage,  urethral stricture, and the cardiopulmonary risks such as myocardial infarction, stroke, death, venothromboembolism, etc. were explained. The risk of open surgical conversion for robotic/laparoscopic prostatectomy was also discussed.   After a full discussion, he does wish to proceed with primary surgical therapy. We did discuss the option of proceeding with surgical therapy on a clinical trial and we reviewed the PROTEUS trial today. He did express some interest in will be provided additional information regarding this trial by our research staff. In the meantime, he is interested in proceeding with primary surgical therapy upfront. He understands that he has a high risk of necessitating multi modality therapy. He will tentatively be plan to proceed with a bilateral nerve-sparing robot assisted laparoscopic radical prostatectomy and bilateral pelvic lymphadenectomy. He places his longevity as his very 1st priority over sexual function. We did agree to consider a preoperative MRI for surgical planning purposes regarding nerve sparing any understands how those results could potentially change our approach.   In addition, considering his father's history of prostate cancer and breast cancer as well as his sister's history of metastatic breast cancer, I did recommend genetic counseling and testing. This also will be arranged at his convenience.   2. Pulmonary nodule: He is a nonsmoker. He will need repeat CT imaging of the chest in approximately 6 months after discussion in the GU tumor Board Conference this morning.   Cc: Dr. Kathlene November  Dr. Irine Seal  Dr. Zola Button  Dr. Tyler Pita     E & M CODES: We spent 62 minutes dedicated to evaluation and management time, including face to face interaction, discussions on coordination of care, documentation, result review, and discussion with others as applicable.

## 2020-06-19 NOTE — Progress Notes (Signed)
                               Care Plan Summary  Name: [name] DOB: [birth year]   Your Medical Team:   Urologist -  Dr. Raynelle Bring, [facility]  Radiation Oncologist - Dr. Tyler Pita, [facility]   Medical Oncologist - Dr. Zola Button, [place]  Recommendations: 1) Robotic prostatectomy  2) Genetics   * These recommendations are based on information available as of today's consult.      Recommendations may change depending on the results of further tests or exams.    Next Steps: 1) Dr. Alinda Money will schedule surgery 2) Referral to genetics    When appointments need to be scheduled, you will be contacted by [facility] and/or [facility].  Questions?  Please do not hesitate to call Justin Rue, RN, BSN, OCN at [phone] any questions or concerns.  Justin Elliott is your Oncology Nurse Navigator and is available to assist you while you're receiving your medical care at [facility].

## 2020-06-22 ENCOUNTER — Telehealth: Payer: Self-pay | Admitting: Licensed Clinical Social Worker

## 2020-06-22 NOTE — Telephone Encounter (Signed)
Received a new hem referral from Dr. Tammi Klippel for prostate cancer. [name] has been cld and scheduled to see Brianna on 6/6 at 9am. Pt awar to arrive 15 minutes early.

## 2020-06-26 ENCOUNTER — Encounter: Payer: Self-pay | Admitting: Medical Oncology

## 2020-06-26 ENCOUNTER — Other Ambulatory Visit: Payer: Self-pay | Admitting: Urology

## 2020-06-26 DIAGNOSIS — C61 Malignant neoplasm of prostate: Secondary | ICD-10-CM

## 2020-06-29 ENCOUNTER — Other Ambulatory Visit: Payer: Self-pay | Admitting: Internal Medicine

## 2020-07-09 ENCOUNTER — Other Ambulatory Visit: Payer: Self-pay | Admitting: Licensed Clinical Social Worker

## 2020-07-09 DIAGNOSIS — C61 Malignant neoplasm of prostate: Secondary | ICD-10-CM

## 2020-07-12 ENCOUNTER — Other Ambulatory Visit: Payer: BC Managed Care – PPO

## 2020-07-13 ENCOUNTER — Inpatient Hospital Stay: Payer: BC Managed Care – PPO

## 2020-07-13 ENCOUNTER — Inpatient Hospital Stay: Payer: BC Managed Care – PPO | Attending: Oncology | Admitting: Licensed Clinical Social Worker

## 2020-07-13 ENCOUNTER — Encounter: Payer: Self-pay | Admitting: Licensed Clinical Social Worker

## 2020-07-13 ENCOUNTER — Other Ambulatory Visit: Payer: Self-pay

## 2020-07-13 DIAGNOSIS — Z803 Family history of malignant neoplasm of breast: Secondary | ICD-10-CM | POA: Insufficient documentation

## 2020-07-13 DIAGNOSIS — Z8042 Family history of malignant neoplasm of prostate: Secondary | ICD-10-CM | POA: Diagnosis not present

## 2020-07-13 DIAGNOSIS — C61 Malignant neoplasm of prostate: Secondary | ICD-10-CM

## 2020-07-13 LAB — GENETIC SCREENING ORDER

## 2020-07-13 NOTE — Progress Notes (Signed)
REFERRING PROVIDER: Tyler Pita, MD [place],  [place] [phone]  PRIMARY PROVIDER:  Colon Branch, MD  PRIMARY REASON FOR VISIT:  1. Prostate cancer ([place])   2. Family history of breast cancer   3. Family history of prostate cancer      HISTORY OF PRESENT ILLNESS:   Justin Elliott, a 55 y.o. male, was seen for a [place] cancer genetics consultation at the request of Dr. Tammi Klippel due to a personal and family history of cancer.  Justin Elliott presents to clinic today to discuss the possibility of a hereditary predisposition to cancer, genetic testing, and to further clarify his future cancer risks, as well as potential cancer risks for family members.   In 2022, at the age of 11, Justin Elliott was diagnosed with prostate cancer, Gleason 4+5. He is scheduled for prostatectomy on 08/13/2020.   Justin Elliott had his first colonoscopy in 2017, 1 polyp was noted. He has not had other cancer screenings.   CANCER HISTORY:  Oncology History  Prostate cancer ([place])  06/04/2020 Initial Diagnosis   Prostate cancer ([place])   06/19/2020 Cancer Staging   Staging form: Prostate, AJCC 8th Edition - Clinical: Stage IIIC (cT1c, cN0, cM0, PSA: 8.3, Grade Group: 5) - Signed by Wyatt Portela, MD on 06/19/2020 Prostate specific antigen (PSA) range: Less than 10 Histologic grading system: 5 grade system    Past Medical History:  Diagnosis Date  . Elevated serum creatinine   . Family history of breast cancer   . Family history of prostate cancer   . Gout   . HTN (hypertension) 2009  . Polypoid middle turbinate   . Prostate cancer [facility])     Past Surgical History:  Procedure Laterality Date  . FINGER SURGERY Right 1984  . NASAL ENDOSCOPY    . PROSTATE BIOPSY  05/20/2020   +    Social History   Socioeconomic History  . Marital status: Married    Spouse name: Sebron Mcmahill  . Number of children: 2  . Years of education: Not on file  . Highest education level: Not on file   Occupational History  . 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 Smoking status: Never Smoker  . Smokeless tobacco: Never Used  Vaping Use  . Vaping Use: Never used  Substance and Sexual Activity  . Alcohol use: No    Alcohol/week: 0.0 standard drinks  . Drug use: No  . Sexual activity: Yes  Other Topics Concern  . Not on file  Social History Narrative   Household: wife      D 2003    S 1998 (graduated , college)   PHD 2017   2 children: Ysidro Evert and [employer]   Social Determinants of Health   Financial Resource Strain: Not on file  Food Insecurity: Not on file  Transportation Needs: Not on file  Physical Activity: Not on file  Stress: Not on file  Social Connections: Not on file     FAMILY HISTORY:  We obtained a detailed, 4-generation family history.  Significant diagnoses are listed below: Family History  Problem Relation Age of Onset  . Prostate cancer Father 109       died Mar 19, 2015 age 70  . Breast cancer Sister   . Diabetes type I Son   . Stroke Mother 47  . Colon cancer Neg Hx   . CAD Neg Hx     Justin Elliott has 1 daughter, 77, and 1 son, 14,  no cancers. He had 2 sisters. One had breast cancer around age 37 and passed at 78, and the other is living, no known cancer history.   Justin Elliott mother is living at 54. Patient had 5 maternal uncles, 7 maternal aunts, no known cancers for them or for his maternal cousins. Maternal grandmother died at 83, grandfather passed before patient was born.   Justin Elliott father had prostate cancer at 42, he also possibly had breast cancer later in life and died at 61. Patient had 5 paternal uncles, 2 paternal aunts, no known cancers for them or for paternal cousins. Paternal grandmother died in her 62s, grandfather passed over 46.   Justin Elliott is unaware of previous family history of genetic testing for hereditary cancer risks. There is no reported Ashkenazi Jewish ancestry. There is no known  consanguinity.    GENETIC COUNSELING ASSESSMENT: Justin Elliott is a 55 y.o. male with a personal and family history of cancer which is somewhat suggestive of a hereditary cancer syndrome and predisposition to cancer. We, therefore, discussed and recommended the following at today's visit.   DISCUSSION: We discussed that approximately 5-10% of prostate cancer is hereditary  Most cases of hereditary prostate cancer are associated with BRCA1/BRCA2 genes, although there are other genes associated with hereditary cancer as well including genes related to breast cancer and other cancers .  We discussed that testing is beneficial for several reasons including knowing about other cancer risks, identifying potential screening and risk-reduction options that may be appropriate, knowing if an individual is a candidate for certain targeted therapies, and to understand if other family members could be at risk for cancer and allow them to undergo genetic testing.   We reviewed the characteristics, features and inheritance patterns of hereditary cancer syndromes. We also discussed genetic testing, including the appropriate family members to test, the process of testing, insurance coverage and turn-around-time for results. We discussed the implications of a negative, positive and/or variant of uncertain significant result. We recommended Justin Elliott pursue genetic testing for the [employer] CancerNext-Expanded+RNA gene panel.   The CancerNext-Expanded + RNAinsight gene panel offered by [employer] and includes sequencing and rearrangement analysis for the following 77 genes: IP, ALK, APC*, ATM*, AXIN2, BAP1, BARD1, BLM, BMPR1A, BRCA1*, BRCA2*, BRIP1*, CDC73, CDH1*,CDK4, CDKN1B, CDKN2A, CHEK2*, CTNNA1, DICER1, FANCC, FH, FLCN, GALNT12, KIF1B, LZTR1, MAX, MEN1, MET, MLH1*, MSH2*, MSH3, MSH6*, MUTYH*, NBN, NF1*, NF2, NTHL1, PALB2*, PHOX2B, PMS2*, POT1, PRKAR1A, PTCH1, PTEN*, RAD51C*, RAD51D*,RB1, RECQL, RET, SDHA, SDHAF2, SDHB,  SDHC, SDHD, SMAD4, SMARCA4, SMARCB1, SMARCE1, STK11, SUFU, TMEM127, TP53*,TSC1, TSC2, VHL and XRCC2 (sequencing and deletion/duplication); EGFR, EGLN1, HOXB13, KIT, MITF, PDGFRA, POLD1 and POLE (sequencing only); EPCAM and GREM1 (deletion/duplication only).  Based on Justin Elliott personal and family history of cancer, he meets medical criteria for genetic testing. Despite that he meets criteria, he may still have an out of pocket cost. We discussed that if his out of pocket cost for testing is over $100, the laboratory will call and confirm whether he wants to proceed with testing.  If the out of pocket cost of testing is less than $100 he will be billed by the genetic testing laboratory.   PLAN: After considering the risks, benefits, and limitations, Justin Elliott provided informed consent to pursue genetic testing and the blood sample was sent to [facility] for analysis of the CancerNext-Expanded+RNA panel. Results should be available within approximately 2-3 weeks' time, at which point they will be disclosed by telephone to Justin Elliott, as will  any additional recommendations warranted by these results. Justin Elliott will receive a summary of his genetic counseling visit and a copy of his results once available. This information will also be available in Epic.   Justin Elliott questions were answered to his satisfaction today. Our contact information was provided should additional questions or concerns arise. Thank you for the referral and allowing [place] to share in the care of your patient.   Justin Rogue, MS, [facility] Genetic Counselor [email or web address] Phone: [phone]  The patient was seen for a total of 30 minutes in face-to-face genetic counseling.  Patient was seen alone. Drs. Magrinat/Gudena/Feng were available for discussion regarding this case.   _______________________________________________________________________ For Office Staff:  Number of people involved in session:  1 Was an Intern/ student involved with case: no

## 2020-07-27 ENCOUNTER — Telehealth: Payer: Self-pay | Admitting: Licensed Clinical Social Worker

## 2020-07-31 ENCOUNTER — Encounter: Payer: Self-pay | Admitting: Internal Medicine

## 2020-07-31 ENCOUNTER — Other Ambulatory Visit: Payer: Self-pay | Admitting: Internal Medicine

## 2020-08-03 ENCOUNTER — Ambulatory Visit: Payer: Self-pay | Admitting: Licensed Clinical Social Worker

## 2020-08-03 ENCOUNTER — Encounter: Payer: Self-pay | Admitting: Licensed Clinical Social Worker

## 2020-08-03 DIAGNOSIS — Z1379 Encounter for other screening for genetic and chromosomal anomalies: Secondary | ICD-10-CM

## 2020-08-03 DIAGNOSIS — Z803 Family history of malignant neoplasm of breast: Secondary | ICD-10-CM

## 2020-08-03 DIAGNOSIS — Z8042 Family history of malignant neoplasm of prostate: Secondary | ICD-10-CM

## 2020-08-03 DIAGNOSIS — C61 Malignant neoplasm of prostate: Secondary | ICD-10-CM

## 2020-08-03 NOTE — Telephone Encounter (Signed)
Revealed negative genetic testing. This normal result is reassuring and indicates that it is unlikely Justin Elliott cancer is due to a hereditary cause.  It is unlikely that there is an increased risk of another cancer due to a mutation in one of these genes.  However, genetic testing is not perfect, and cannot definitively rule out a hereditary cause.  It will be important for him to keep in contact with genetics to learn if any additional testing may be needed in the future.

## 2020-08-03 NOTE — Progress Notes (Signed)
HPI:  Mr. Snarski was previously seen in the [facility] clinic due to a personal and family history of cancer and concerns regarding a hereditary predisposition to cancer. Please refer to our prior cancer genetics clinic note for more information regarding our discussion, assessment and recommendations, at the time. Mr. Nanna recent genetic test results were disclosed to him, as were recommendations warranted by these results. These results and recommendations are discussed in more detail below.  CANCER HISTORY:  Oncology History  Prostate cancer ([place])  06/04/2020 Initial Diagnosis   Prostate cancer ([place])    06/19/2020 Cancer Staging   Staging form: Prostate, AJCC 8th Edition - Clinical: Stage IIIC (cT1c, cN0, cM0, PSA: 8.3, Grade Group: 5) - Signed by Wyatt Portela, MD on 06/19/2020  Prostate specific antigen (PSA) range: Less than 10  Histologic grading system: 5 grade system     Genetic Testing   Negative genetic testing. No pathogenic variants identified on the [facility] CancerNext-Expanded+RNA panel. The report date is 07/23/2020.  The CancerNext-Expanded + RNAinsight gene panel offered by [employer] and includes sequencing and rearrangement analysis for the following 77 genes: IP, ALK, APC*, ATM*, AXIN2, BAP1, BARD1, BLM, BMPR1A, BRCA1*, BRCA2*, BRIP1*, CDC73, CDH1*,CDK4, CDKN1B, CDKN2A, CHEK2*, CTNNA1, DICER1, FANCC, FH, FLCN, GALNT12, KIF1B, LZTR1, MAX, MEN1, MET, MLH1*, MSH2*, MSH3, MSH6*, MUTYH*, NBN, NF1*, NF2, NTHL1, PALB2*, PHOX2B, PMS2*, POT1, PRKAR1A, PTCH1, PTEN*, RAD51C*, RAD51D*,RB1, RECQL, RET, SDHA, SDHAF2, SDHB, SDHC, SDHD, SMAD4, SMARCA4, SMARCB1, SMARCE1, STK11, SUFU, TMEM127, TP53*,TSC1, TSC2, VHL and XRCC2 (sequencing and deletion/duplication); EGFR, EGLN1, HOXB13, KIT, MITF, PDGFRA, POLD1 and POLE (sequencing only); EPCAM and GREM1 (deletion/duplication only).     FAMILY HISTORY:  We obtained a detailed, 4-generation family history.  Significant  diagnoses are listed below: Family History  Problem Relation Age of Onset   Prostate cancer Father 63       died Mar 09, 2015 age 74   Breast cancer Sister    Diabetes type I Son    Stroke Mother 83   Colon cancer Neg Hx    CAD Neg Hx       Mr. Lieurance has 1 daughter, 48, and 1 son, 31, no cancers. He had 2 sisters. One had breast cancer around age 55 and passed at 59, and the other is living, no known cancer history.   Mr. Mcadory mother is living at 61. Patient had 5 maternal uncles, 7 maternal aunts, no known cancers for them or for his maternal cousins. Maternal grandmother died at 57, grandfather passed before patient was born.   Mr. Tolen father had prostate cancer at 51, he also possibly had breast cancer later in life and died at 64. Patient had 5 paternal uncles, 2 paternal aunts, no known cancers for them or for paternal cousins. Paternal grandmother died in her 51s, grandfather passed over 63.    Mr. Tadros is unaware of previous family history of genetic testing for hereditary cancer risks. There is no reported Ashkenazi Jewish ancestry. There is no known consanguinity.     GENETIC TEST RESULTS: Genetic testing reported out on 07/23/2020 through the [employer] CancerNext-Expanded+RNA cancer panel found no pathogenic mutations.   The CancerNext-Expanded + RNAinsight gene panel offered by [employer] and includes sequencing and rearrangement analysis for the following 77 genes: IP, ALK, APC*, ATM*, AXIN2, BAP1, BARD1, BLM, BMPR1A, BRCA1*, BRCA2*, BRIP1*, CDC73, CDH1*,CDK4, CDKN1B, CDKN2A, CHEK2*, CTNNA1, DICER1, FANCC, FH, FLCN, GALNT12, KIF1B, LZTR1, MAX, MEN1, MET, MLH1*, MSH2*, MSH3, MSH6*, MUTYH*, NBN, NF1*, NF2, NTHL1, PALB2*, PHOX2B,  PMS2*, POT1, PRKAR1A, PTCH1, PTEN*, RAD51C*, RAD51D*,RB1, RECQL, RET, SDHA, SDHAF2, SDHB, SDHC, SDHD, SMAD4, SMARCA4, SMARCB1, SMARCE1, STK11, SUFU, TMEM127, TP53*,TSC1, TSC2, VHL and XRCC2 (sequencing and deletion/duplication); EGFR, EGLN1,  HOXB13, KIT, MITF, PDGFRA, POLD1 and POLE (sequencing only); EPCAM and GREM1 (deletion/duplication only).   The test report has been scanned into EPIC and is located under the Molecular Pathology section of the Results Review tab.  A portion of the result report is included below for reference.     We discussed that because current genetic testing is not perfect, it is possible there may be a gene mutation in one of these genes that current testing cannot detect, but that chance is small.  There could be another gene that has not yet been discovered, or that we have not yet tested, that is responsible for the cancer diagnoses in the family. It is also possible there is a hereditary cause for the cancer in the family that Mr. Pooley did not inherit and therefore was not identified in his testing.  Therefore, it is important to remain in touch with cancer genetics in the future so that we can continue to offer Mr. Froning the most up to date genetic testing.   ADDITIONAL GENETIC TESTING: We discussed with Mr. Rippeon that his genetic testing was fairly extensive.  If there are genes identified to increase cancer risk that can be analyzed in the future, we would be happy to discuss and coordinate this testing at that time.    CANCER SCREENING RECOMMENDATIONS: Mr. Ballweg test result is considered negative (normal).  This means that we have not identified a hereditary cause for his  personal and family history of cancer at this time. Most cancers happen by chance and this negative test suggests that his cancer may fall into this category.    While reassuring, this does not definitively rule out a hereditary predisposition to cancer. It is still possible that there could be genetic mutations that are undetectable by current technology. There could be genetic mutations in genes that have not been tested or identified to increase cancer risk.  Therefore, it is recommended he continue to follow the cancer  management and screening guidelines provided by his oncology and primary healthcare provider.   An individual's cancer risk and medical management are not determined by genetic test results alone. Overall cancer risk assessment incorporates additional factors, including personal medical history, family history, and any available genetic information that may result in a personalized plan for cancer prevention and surveillance.  RECOMMENDATIONS FOR FAMILY MEMBERS:  Relatives in this family might be at some increased risk of developing cancer, over the general population risk, simply due to the family history of cancer.  We recommended male relatives in this family have a yearly mammogram beginning at age 14, or 64 years younger than the earliest onset of cancer, an annual clinical breast exam, and perform monthly breast self-exams. Male relatives in this family should also have a gynecological exam as recommended by their primary provider.  All family members should be referred for colonoscopy starting at age 16.    It is also possible there is a hereditary cause for the cancer in Mr. Bartnick family that he did not inherit and therefore was not identified in him.  Based on Mr. Givan family history, we recommended his sister's relatives (the sister that had triple negative breast cancer) have genetic counseling and testing. Mr. Jarmon will let us know if we can be of any assistance in coordinating  genetic counseling and/or testing for these family members.  FOLLOW-UP: Lastly, we discussed with Mr. Wendt that cancer genetics is a rapidly advancing field and it is possible that new genetic tests will be appropriate for him and/or his family members in the future. We encouraged him to remain in contact with cancer genetics on an annual basis so we can update his personal and family histories and let him know of advances in cancer genetics that may benefit this family.   Our contact number was  provided. Mr. Mapel questions were answered to his satisfaction, and he knows he is welcome to call us at anytime with additional questions or concerns.   Faith Rogue, MS, [facility] Genetic Counselor [email or web address] Phone: [phone]

## 2020-08-05 NOTE — Progress Notes (Signed)
DUE TO COVID-19 ONLY ONE VISITOR IS ALLOWED TO COME WITH YOU AND STAY IN THE WAITING ROOM ONLY DURING PRE OP AND PROCEDURE DAY OF SURGERY. THE 1 VISITOR  MAY VISIT WITH YOU AFTER SURGERY IN YOUR PRIVATE ROOM DURING VISITING HOURS ONLY!  YOU NEED TO HAVE A COVID 19 TEST ON__7/08/2020 _____ @_______ , THIS TEST MUST BE DONE BEFORE SURGERY,  COVID TESTING SITE [address] [place] [address], IT IS ON THE RIGHT GOING OUT [address] APPROXIMATELY  2 MINUTES PAST [employer] ON THE RIGHT. ONCE YOUR COVID TEST IS COMPLETED,  PLEASE BEGIN THE QUARANTINE INSTRUCTIONS AS OUTLINED IN YOUR HANDOUT.                BAIRD POLINSKI  08/05/2020   Your procedure is scheduled on:  08/17/2020   Report to [facility] Main  Entrance   Report to admitting at    [phone]     Call this number if you have problems the morning of surgery [phone]    Remember: Do not eat food , candy gum or mints :After Midnight. You may have clear liquids from midnight until  0415am  Magnesium Citrate at 12 noon day before surgery.   Fleets enema nite before surgery    CLEAR LIQUID DIET   Foods Allowed                                                                       Coffee and tea, regular and decaf                              Plain Jell-O any favor except red or purple                                            Fruit ices (not with fruit pulp)                                      Iced Popsicles                                     Carbonated beverages, regular and diet                                    Cranberry, grape and apple juices Sports drinks like Gatorade Lightly seasoned clear broth or consume(fat free) Sugar, honey syrup   _____________________________________________________________________    BRUSH YOUR TEETH MORNING OF SURGERY AND RINSE YOUR MOUTH OUT, NO CHEWING GUM CANDY OR MINTS.     Take these medicines the morning of surgery with A SIP OF WATER:     Coreg,  amlodipine  DO NOT TAKE ANY DIABETIC MEDICATIONS DAY OF YOUR SURGERY  You may not have any metal on your body including hair pins and              piercings  Do not wear jewelry, make-up, lotions, powders or perfumes, deodorant             Do not wear nail polish on your fingernails.  Do not shave  48 hours prior to surgery.              Men may shave face and neck.   Do not bring valuables to the hospital. [place].  Contacts, dentures or bridgework may not be worn into surgery.  Leave suitcase in the car. After surgery it may be brought to your room.     Patients discharged the day of surgery will not be allowed to drive home. IF YOU ARE HAVING SURGERY AND GOING HOME THE SAME DAY, YOU MUST HAVE AN ADULT TO DRIVE YOU HOME AND BE WITH YOU FOR 24 HOURS. YOU MAY GO HOME BY TAXI OR UBER OR ORTHERWISE, BUT AN ADULT MUST ACCOMPANY YOU HOME AND STAY WITH YOU FOR 24 HOURS.  Name and phone number of your driver:  Special Instructions: N/A              Please read over the following fact sheets you were given: _____________________________________________________________________  [facility] - Preparing for Surgery Before surgery, you can play an important role.  Because skin is not sterile, your skin needs to be as free of germs as possible.  You can reduce the number of germs on your skin by washing with CHG (chlorahexidine gluconate) soap before surgery.  CHG is an antiseptic cleaner which kills germs and bonds with the skin to continue killing germs even after washing. Please DO NOT use if you have an allergy to CHG or antibacterial soaps.  If your skin becomes reddened/irritated stop using the CHG and inform your nurse when you arrive at Short Stay. Do not shave (including legs and underarms) for at least 48 hours prior to the first CHG shower.  You may shave your face/neck. Please follow these instructions  carefully:  1.  Shower with CHG Soap the night before surgery and the  morning of Surgery.  2.  If you choose to wash your hair, wash your hair first as usual with your  normal  shampoo.  3.  After you shampoo, rinse your hair and body thoroughly to remove the  shampoo.                           4.  Use CHG as you would any other liquid soap.  You can apply chg directly  to the skin and wash                       Gently with a scrungie or clean washcloth.  5.  Apply the CHG Soap to your body ONLY FROM THE NECK DOWN.   Do not use on face/ open                           Wound or open sores. Avoid contact with eyes, ears mouth and genitals (private parts).  Wash face,  Genitals (private parts) with your normal soap.             6.  Wash thoroughly, paying special attention to the area where your surgery  will be performed.  7.  Thoroughly rinse your body with warm water from the neck down.  8.  DO NOT shower/wash with your normal soap after using and rinsing off  the CHG Soap.                9.  Pat yourself dry with a clean towel.            10.  Wear clean pajamas.            11.  Place clean sheets on your bed the night of your first shower and do not  sleep with pets. Day of Surgery : Do not apply any lotions/deodorants the morning of surgery.  Please wear clean clothes to the hospital/surgery center.  FAILURE TO FOLLOW THESE INSTRUCTIONS MAY RESULT IN THE CANCELLATION OF YOUR SURGERY PATIENT SIGNATURE_________________________________  NURSE SIGNATURE__________________________________  ________________________________________________________________________

## 2020-08-07 ENCOUNTER — Encounter (HOSPITAL_COMMUNITY): Payer: Self-pay

## 2020-08-07 ENCOUNTER — Encounter (HOSPITAL_COMMUNITY)
Admission: RE | Admit: 2020-08-07 | Discharge: 2020-08-07 | Disposition: A | Discharge status: Home / Self Care | Payer: BC Managed Care – PPO | Source: Ambulatory Visit | Attending: Urology | Admitting: Urology

## 2020-08-07 ENCOUNTER — Ambulatory Visit
Admission: RE | Admit: 2020-08-07 | Discharge: 2020-08-07 | Disposition: A | Discharge status: Home / Self Care | Payer: BC Managed Care – PPO | Source: Ambulatory Visit | Attending: Urology | Admitting: Urology

## 2020-08-07 ENCOUNTER — Other Ambulatory Visit: Payer: Self-pay

## 2020-08-07 DIAGNOSIS — C61 Malignant neoplasm of prostate: Secondary | ICD-10-CM

## 2020-08-07 DIAGNOSIS — I10 Essential (primary) hypertension: Secondary | ICD-10-CM | POA: Diagnosis not present

## 2020-08-07 DIAGNOSIS — Z01818 Encounter for other preprocedural examination: Secondary | ICD-10-CM | POA: Diagnosis not present

## 2020-08-07 DIAGNOSIS — Z79899 Other long term (current) drug therapy: Secondary | ICD-10-CM | POA: Diagnosis not present

## 2020-08-07 LAB — CBC
HCT: 40.6 % (ref 39.0–52.0)
Hemoglobin: 13.3 g/dL (ref 13.0–17.0)
MCH: 27.9 pg (ref 26.0–34.0)
MCHC: 32.8 g/dL (ref 30.0–36.0)
MCV: 85.3 fL (ref 80.0–100.0)
Platelets: 227 10*3/uL (ref 150–400)
RBC: 4.76 MIL/uL (ref 4.22–5.81)
RDW: 13.4 % (ref 11.5–15.5)
WBC: 4.1 10*3/uL (ref 4.0–10.5)
nRBC: 0 % (ref 0.0–0.2)

## 2020-08-07 LAB — BASIC METABOLIC PANEL
Anion gap: 9 (ref 5–15)
BUN: 17 mg/dL (ref 6–20)
CO2: 24 mmol/L (ref 22–32)
Calcium: 9.4 mg/dL (ref 8.9–10.3)
Chloride: 107 mmol/L (ref 98–111)
Creatinine, Ser: 1.54 mg/dL — ABNORMAL HIGH (ref 0.61–1.24)
GFR, Estimated: 53 mL/min — ABNORMAL LOW (ref >60)
Glucose, Bld: 127 mg/dL — ABNORMAL HIGH (ref 70–99)
Potassium: 3.9 mmol/L (ref 3.5–5.1)
Sodium: 140 mmol/L (ref 135–145)

## 2020-08-07 MED ORDER — GADOBENATE DIMEGLUMINE 529 MG/ML IV SOLN
20.0000 mL | Freq: Once | INTRAVENOUS | Status: AC | PRN
  Administered 2020-08-07: 20 mL via INTRAVENOUS

## 2020-08-07 NOTE — Progress Notes (Addendum)
Anesthesia Review:  PCP: dr Gertha Calkin 05/18/20  Cardiologist : Chest x-ray : EKG : 08/07/20  Echo : Stress test: Cardiac Cath :  Activity level: can do a flight of stairs without difficulty  Sleep Study/ CPAP : none  Fasting Blood Sugar :      / Checks Blood Sugar -- times a day:   Blood Thinner/ Instructions /Last Dose: ASA / Instructions/ Last Dose :   BMP done 08/07/20 routed to Dr Alinda Money.

## 2020-08-11 NOTE — Progress Notes (Addendum)
Anesthesia Chart Review:   Case: [id number] Date/Time: 08/17/20 0700   Procedures:      XI ROBOTIC ASSISTED LAPAROSCOPIC RADICAL PROSTATECTOMY LEVEL 2     LYMPHADENECTOMY, PELVIC (Bilateral)   Anesthesia type: General   Pre-op diagnosis: PROSTATE CANCER   Location: [facility] / [facility]   Surgeons: Raynelle Bring, MD       Addendum:  Pt saw cardiologist Rudean Haskell, MD on 08/14/20 for abnormal EKG, pre-op eval. Cleared for surgery without further cardiac testing at acceptable risk.   Willeen Cass, PhD, FNP-BC [facility] Short Stay Surgical Center/Anesthesiology Phone: [phone] 08/14/2020 3:47 PM    DISCUSSION: Pt is 55 years old with hx HTN, prostate cancer  Baseline Cr ~1.5. Per PCP notes, renal US normal in 2017, pt evaluated by nephrology in 2018 (BP control, weight loss, avoid NSAIDs recommended)  Pt has new T wave inversions on EKG consistent with lateral ischemia. Reviewed EKG with Dr. Kalman Shan. Pt will need cardiology evaluation before surgery. I notified Coni in Dr. Lynne Logan office.   VS: BP 137/87   Pulse 72   Temp 36.8 C (Oral)   Resp 16   Ht 5\' 11"  (1.803 m)   Wt 104.8 kg   SpO2 98%   BMI 32.22 kg/m   PROVIDERS: - PCP is Colon Branch, MD   LABS: Labs reviewed: Acceptable for surgery. (all labs ordered are listed, but only abnormal results are displayed)  Labs Reviewed  BASIC METABOLIC PANEL - Abnormal; Notable for the following components:      Result Value   Glucose, Bld 127 (*)    Creatinine, Ser 1.54 (*)    GFR, Estimated 53 (*)    All other components within normal limits  CBC  TYPE AND SCREEN    EKG 08/07/20: NSR. Left axis deviation. Lateral T wave inversion, consider ischemia   CV: N/A  Past Medical History:  Diagnosis Date   Elevated serum creatinine    Family history of breast cancer    Family history of prostate cancer    Gout    HTN (hypertension) 2009   Polypoid middle turbinate    Prostate cancer [facility])     Past Surgical  History:  Procedure Laterality Date   FINGER SURGERY Right 1984   NASAL ENDOSCOPY     PROSTATE BIOPSY  05/20/2020   +    MEDICATIONS:  allopurinol (ZYLOPRIM) 100 MG tablet   amLODipine (NORVASC) 10 MG tablet   carvedilol (COREG) 25 MG tablet   colchicine 0.6 MG tablet   No current facility-administered medications for this encounter.    Willeen Cass, PhD, FNP-BC [facility] Short Stay Surgical Center/Anesthesiology Phone: [phone] 08/12/2020 11:06 AM

## 2020-08-12 ENCOUNTER — Encounter: Payer: Self-pay | Admitting: Internal Medicine

## 2020-08-12 NOTE — Anesthesia Preprocedure Evaluation (Addendum)
Anesthesia Evaluation  Patient identified by MRN, date of birth, ID band Patient awake    Reviewed: Allergy & Precautions, NPO status , Patient's Chart, lab work & pertinent test results, reviewed documented beta blocker date and time   History of Anesthesia Complications Negative for: history of anesthetic complications  Airway Mallampati: III  TM Distance: >3 FB Neck ROM: Full    Dental  (+) Dental Advisory Given, Teeth Intact   Pulmonary neg pulmonary ROS,    Pulmonary exam normal        Cardiovascular hypertension, Pt. on home beta blockers and Pt. on medications Normal cardiovascular exam     Neuro/Psych negative neurological ROS  negative psych ROS   GI/Hepatic negative GI ROS, Neg liver ROS,   Endo/Other   Obesity   Renal/GU Renal InsufficiencyRenal disease    Prostate cancer     Musculoskeletal  Gout    Abdominal   Peds  Hematology negative hematology ROS (+)   Anesthesia Other Findings Covid test negative   Reproductive/Obstetrics                           Anesthesia Physical Anesthesia Plan  ASA: 2  Anesthesia Plan: General   Post-op Pain Management:    Induction: Intravenous  PONV Risk Score and Plan: 4 or greater and Treatment may vary due to age or medical condition, Ondansetron, Dexamethasone, Midazolam and Scopolamine patch - Pre-op  Airway Management Planned: Oral ETT  Additional Equipment: None  Intra-op Plan:   Post-operative Plan: Extubation in OR  Informed Consent: I have reviewed the patients History and Physical, chart, labs and discussed the procedure including the risks, benefits and alternatives for the proposed anesthesia with the patient or authorized representative who has indicated his/her understanding and acceptance.     Dental advisory given  Plan Discussed with: CRNA and Anesthesiologist  Anesthesia Plan Comments:        Anesthesia Quick Evaluation

## 2020-08-13 ENCOUNTER — Other Ambulatory Visit (HOSPITAL_COMMUNITY)
Admission: RE | Admit: 2020-08-13 | Discharge: 2020-08-13 | Disposition: A | Discharge status: Home / Self Care | Payer: BC Managed Care – PPO | Source: Ambulatory Visit | Attending: Urology | Admitting: Urology

## 2020-08-13 DIAGNOSIS — Z01812 Encounter for preprocedural laboratory examination: Secondary | ICD-10-CM | POA: Insufficient documentation

## 2020-08-13 DIAGNOSIS — Z20822 Contact with and (suspected) exposure to covid-19: Secondary | ICD-10-CM | POA: Insufficient documentation

## 2020-08-13 LAB — SARS CORONAVIRUS 2 (TAT 6-24 HRS): SARS Coronavirus 2: NEGATIVE

## 2020-08-14 ENCOUNTER — Telehealth: Payer: Self-pay | Admitting: Internal Medicine

## 2020-08-14 ENCOUNTER — Ambulatory Visit (INDEPENDENT_AMBULATORY_CARE_PROVIDER_SITE_OTHER): Payer: BC Managed Care – PPO | Admitting: Internal Medicine

## 2020-08-14 ENCOUNTER — Encounter: Payer: Self-pay | Admitting: Internal Medicine

## 2020-08-14 ENCOUNTER — Other Ambulatory Visit: Payer: Self-pay

## 2020-08-14 VITALS — BP 138/84 | HR 82 | Ht 71.5 in | Wt 234.0 lb

## 2020-08-14 DIAGNOSIS — E785 Hyperlipidemia, unspecified: Secondary | ICD-10-CM

## 2020-08-14 DIAGNOSIS — I10 Essential (primary) hypertension: Secondary | ICD-10-CM

## 2020-08-14 DIAGNOSIS — Z01810 Encounter for preprocedural cardiovascular examination: Secondary | ICD-10-CM

## 2020-08-14 NOTE — Patient Instructions (Signed)
Medication Instructions:  Your physician recommends that you continue on your current medications as directed. Please refer to the Current Medication list given to you today.  *If you need a refill on your cardiac medications before your next appointment, please call your pharmacy*   Lab Work: NONE If you have labs (blood work) drawn today and your tests are completely normal, you will receive your results only by: [place] (if you have MyChart) OR A paper copy in the mail If you have any lab test that is abnormal or we need to change your treatment, we will call you to review the results.   Testing/Procedures: NONE   Follow-Up: At [facility], you and your health needs are our priority.  As part of our continuing mission to provide you with exceptional heart care, we have created designated Provider Care Teams.  These Care Teams include your primary Cardiologist (physician) and Advanced Practice Providers (APPs -  Physician Assistants and Nurse Practitioners) who all work together to provide you with the care you need, when you need it.  We recommend signing up for the patient portal called "MyChart".  Sign up information is provided on this After Visit Summary.  MyChart is used to connect with patients for Virtual Visits (Telemedicine).  Patients are able to view lab/test results, encounter notes, upcoming appointments, etc.  Non-urgent messages can be sent to your provider as well.   To learn more about what you can do with MyChart, go to [email or web address].    Your next appointment:   6 month(s)  The format for your next appointment:   In Person  Provider:   You may see Rudean Haskell, MD or one of the following Advanced Practice Providers on your designated Care Team:   Melina Copa, PA-C Ermalinda Barrios, PA-C

## 2020-08-14 NOTE — Telephone Encounter (Signed)
Spoke with scheduler for [facility] she requested a verbal clearance for pt to proceed with surgery for 08/17/20.  I reviewed the following office night from today's OV with Dr. Gasper Sells: - The Revised Cardiac Risk Index = 0=0.4%: very low estimated risk of perioperative myocardial infarction, pulmonary edema, ventricular fibrillation, cardiac arrest, or complete heart block. - DASI score of 40.45 associated with 7.71 functional mets - No further cardiac testing is recommended prior to surgery. - The patient may proceed to surgery at acceptable risk.   No further information needed.

## 2020-08-14 NOTE — Progress Notes (Signed)
Cardiology Office Note:    Date:  08/14/2020   ID:  Justin Elliott, DOB 1965-11-15, MRN [id number]  PCP:  Colon Branch, MD   [facility] Providers Cardiologist:  Werner Lean, MD     CC: Questions before surgery Consulted for the evaluation of surgical-cardiovascular risk stratification at the behest of Colon Branch, MD  [facility] Clearance: Dr. Raynelle Bring  History of Present Illness:    Justin Elliott is a 55 y.o. male with a hx of HTN, HLD,  gout, and prostate cancer pending surgery seen in evaluation 08/14/20.  Patient notes that he is feeling physically ok.  Has had no chest pain, chest pressure, chest tightness, chest stinging.  Patient exertion notable for run and feels no symptoms.  No shortness of breath, DOE .  No PND or orthopnea.  No bendopnea, weight gain, leg swelling , or abdominal swelling.  No syncope or near syncope . Notes  no palpitations or funny heart beats.     Ambulatory BP 130/70.  Past Medical History:  Diagnosis Date   Elevated serum creatinine    Family history of breast cancer    Family history of prostate cancer    Gout    HTN (hypertension) 2009   Polypoid middle turbinate    Prostate cancer [facility])     Past Surgical History:  Procedure Laterality Date   FINGER SURGERY Right 1984   NASAL ENDOSCOPY     PROSTATE BIOPSY  05/20/2020   +    Current Medications: Current Meds  Medication Sig   allopurinol (ZYLOPRIM) 100 MG tablet Take 1 tablet (100 mg total) by mouth daily.   amLODipine (NORVASC) 10 MG tablet Take 1 tablet (10 mg total) by mouth daily.   carvedilol (COREG) 25 MG tablet Take 1 tablet (25 mg total) by mouth 2 (two) times daily with a meal.   colchicine 0.6 MG tablet TAKE 1 TABLET (0.6 MG TOTAL) BY MOUTH 2 (TWO) TIMES DAILY AS NEEDED (GOUT).   desonide (DESOWEN) 0.05 % lotion Apply topically as needed.     Allergies:   Losartan   Social History   Socioeconomic History   Marital status: Married     Spouse name: Giuseppe Duchemin   Number of children: 2   Years of education: Not on file   Highest education level: Not on file  Occupational History   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  Tobacco Use   Smoking status: Never   Smokeless tobacco: Never  Vaping Use   Vaping Use: Never used  Substance and Sexual Activity   Alcohol use: No    Alcohol/week: 0.0 standard drinks   Drug use: No   Sexual activity: Yes  Other Topics Concern   Not on file  Social History Narrative   Household: wife      D 2003    S 1998 (graduated , college)   PHD 2017   2 children: Ysidro Evert and [employer]   Social Determinants of Health   Financial Resource Strain: Not on file  Food Insecurity: Not on file  Transportation Needs: Not on file  Physical Activity: Not on file  Stress: Not on file  Social Connections: Not on file    Social: has son and daughter, [place] fan, [employer] [place], [place], [place] to [place]  Family History: The patient's family history includes Breast cancer in his sister; Diabetes type I in his son; Prostate cancer (age of onset: 33) in his father; Stroke (age of  onset: 57) in his mother. There is no history of Colon cancer or CAD. Breast Cancer in sister (died)  ROS:   Please see the history of present illness.     All other systems reviewed and are negative.  EKGs/Labs/Other Studies Reviewed:    The following studies were reviewed today:  EKG:   08/07/20: SR 74 lateral TWI  Recent Labs: 09/20/2019: TSH 2.20 05/18/2020: ALT 22 08/07/2020: BUN 17; Creatinine, Ser 1.54; Hemoglobin 13.3; Platelets 227; Potassium 3.9; Sodium 140  Recent Lipid Panel    Component Value Date/Time   CHOL 202 (H) 09/20/2019 0858   TRIG 181.0 (H) 09/20/2019 0858   HDL 27.80 (L) 09/20/2019 0858   CHOLHDL 7 09/20/2019 0858   VLDL 36.2 09/20/2019 0858   LDLCALC 138 (H) 09/20/2019 0858   LDLDIRECT 126.0 09/14/2018 1127    Risk Assessment/Calculations:     N/A      Physical Exam:     VS:  BP 138/84   Pulse 82   Ht 5' 11.5" (1.816 m)   Wt 106.1 kg   SpO2 98%   BMI 32.18 kg/m     Wt Readings from Last 3 Encounters:  08/14/20 106.1 kg  08/07/20 104.8 kg  06/19/20 105.6 kg    GEN:  Well nourished, well developed in no acute distress HEENT: Normal NECK: No JVD; No carotid bruits LYMPHATICS: No lymphadenopathy CARDIAC: RRR, no murmurs, rubs, gallops RESPIRATORY:  Clear to auscultation without rales, wheezing or rhonchi  ABDOMEN: Soft, non-tender, non-distended MUSCULOSKELETAL:  No edema; No deformity  SKIN: Warm and dry NEUROLOGIC:  Alert and oriented x 3 PSYCHIATRIC:  Normal affect   ASSESSMENT:    1. Preoperative cardiovascular examination   2. Primary hypertension   3. Hyperlipidemia, unspecified hyperlipidemia type    PLAN:    Preoperative Risk Assessment HTN & HLD Prostate Cancer - The Revised Cardiac Risk Index = 0=0.4%: very low estimated risk of perioperative myocardial infarction, pulmonary edema, ventricular fibrillation, cardiac arrest, or complete heart block.  - DASI score of 40.45 associated with 7.71 functional mets - No further cardiac testing is recommended prior to surgery.  - The patient may proceed to surgery at acceptable risk.    Will see in 6 months for HTN and HLD discussions, then PRN after        Medication Adjustments/Labs and Tests Ordered: Current medicines are reviewed at length with the patient today.  Concerns regarding medicines are outlined above.  No orders of the defined types were placed in this encounter.  No orders of the defined types were placed in this encounter.   Patient Instructions  Medication Instructions:  Your physician recommends that you continue on your current medications as directed. Please refer to the Current Medication list given to you today.  *If you need a refill on your cardiac medications before your next appointment, please call your pharmacy*   Lab Work: NONE If you have  labs (blood work) drawn today and your tests are completely normal, you will receive your results only by: [place] (if you have MyChart) OR A paper copy in the mail If you have any lab test that is abnormal or we need to change your treatment, we will call you to review the results.   Testing/Procedures: NONE   Follow-Up: At [facility], you and your health needs are our priority.  As part of our continuing mission to provide you with exceptional heart care, we have created designated Provider Care Teams.  These Care  Teams include your primary Cardiologist (physician) and Advanced Practice Providers (APPs -  Physician Assistants and Nurse Practitioners) who all work together to provide you with the care you need, when you need it.  We recommend signing up for the patient portal called "MyChart".  Sign up information is provided on this After Visit Summary.  MyChart is used to connect with patients for Virtual Visits (Telemedicine).  Patients are able to view lab/test results, encounter notes, upcoming appointments, etc.  Non-urgent messages can be sent to your provider as well.   To learn more about what you can do with MyChart, go to [email or web address].    Your next appointment:   6 month(s)  The format for your next appointment:   In Person  Provider:   You may see Rudean Haskell, MD or one of the following Advanced Practice Providers on your designated Care Team:   Melina Copa, PA-C Ermalinda Barrios, PA-C       Signed, Werner Lean, MD  08/14/2020 3:37 PM    [place]

## 2020-08-14 NOTE — Telephone Encounter (Signed)
[facility] calling to check on the surgical request for this patient

## 2020-08-17 ENCOUNTER — Encounter (HOSPITAL_COMMUNITY)
Admission: RE | Disposition: A | Discharge status: Home / Self Care | Payer: Self-pay | Source: Home / Self Care | Attending: Urology

## 2020-08-17 ENCOUNTER — Other Ambulatory Visit: Payer: Self-pay | Admitting: Internal Medicine

## 2020-08-17 ENCOUNTER — Observation Stay (HOSPITAL_COMMUNITY)
Admission: RE | Admit: 2020-08-17 | Discharge: 2020-08-18 | Disposition: A | Discharge status: Home / Self Care | Payer: BC Managed Care – PPO | Attending: Urology | Admitting: Urology

## 2020-08-17 ENCOUNTER — Ambulatory Visit (HOSPITAL_COMMUNITY): Payer: BC Managed Care – PPO | Admitting: Anesthesiology

## 2020-08-17 ENCOUNTER — Telehealth: Payer: Self-pay | Admitting: Internal Medicine

## 2020-08-17 ENCOUNTER — Other Ambulatory Visit: Payer: Self-pay

## 2020-08-17 ENCOUNTER — Ambulatory Visit (HOSPITAL_COMMUNITY): Payer: BC Managed Care – PPO | Admitting: Emergency Medicine

## 2020-08-17 ENCOUNTER — Encounter (HOSPITAL_COMMUNITY): Payer: Self-pay | Admitting: Urology

## 2020-08-17 DIAGNOSIS — C61 Malignant neoplasm of prostate: Principal | ICD-10-CM | POA: Diagnosis present

## 2020-08-17 DIAGNOSIS — I10 Essential (primary) hypertension: Secondary | ICD-10-CM | POA: Insufficient documentation

## 2020-08-17 DIAGNOSIS — C775 Secondary and unspecified malignant neoplasm of intrapelvic lymph nodes: Secondary | ICD-10-CM | POA: Insufficient documentation

## 2020-08-17 DIAGNOSIS — Z79899 Other long term (current) drug therapy: Secondary | ICD-10-CM | POA: Insufficient documentation

## 2020-08-17 DIAGNOSIS — Z8042 Family history of malignant neoplasm of prostate: Secondary | ICD-10-CM | POA: Diagnosis not present

## 2020-08-17 HISTORY — PX: ROBOT ASSISTED LAPAROSCOPIC RADICAL PROSTATECTOMY: SHX5141

## 2020-08-17 HISTORY — PX: LYMPHADENECTOMY: SHX5960

## 2020-08-17 LAB — TYPE AND SCREEN
ABO/RH(D): B POS
Antibody Screen: NEGATIVE

## 2020-08-17 LAB — HEMOGLOBIN AND HEMATOCRIT, BLOOD
HCT: 41.1 % (ref 39.0–52.0)
Hemoglobin: 13.3 g/dL (ref 13.0–17.0)

## 2020-08-17 LAB — ABO/RH: ABO/RH(D): B POS

## 2020-08-17 SURGERY — XI ROBOTIC ASSISTED LAPAROSCOPIC RADICAL PROSTATECTOMY LEVEL 2
Anesthesia: General

## 2020-08-17 MED ORDER — HEPARIN SODIUM (PORCINE) 1000 UNIT/ML IJ SOLN
INTRAMUSCULAR | Status: AC
  Filled 2020-08-17: qty 1

## 2020-08-17 MED ORDER — EPHEDRINE 5 MG/ML INJ
INTRAVENOUS | Status: AC
  Filled 2020-08-17: qty 10

## 2020-08-17 MED ORDER — ONDANSETRON HCL 4 MG/2ML IJ SOLN
INTRAMUSCULAR | Status: DC | PRN
  Administered 2020-08-17: 4 mg via INTRAVENOUS

## 2020-08-17 MED ORDER — SODIUM CHLORIDE 0.9 % IV BOLUS
1000.0000 mL | Freq: Once | INTRAVENOUS | Status: AC
  Administered 2020-08-17: 1000 mL via INTRAVENOUS

## 2020-08-17 MED ORDER — PHENYLEPHRINE 40 MCG/ML (10ML) SYRINGE FOR IV PUSH (FOR BLOOD PRESSURE SUPPORT)
PREFILLED_SYRINGE | INTRAVENOUS | Status: DC | PRN
  Administered 2020-08-17 (×4): 80 ug via INTRAVENOUS

## 2020-08-17 MED ORDER — ONDANSETRON HCL 4 MG/2ML IJ SOLN: 4.0000 mg | INTRAMUSCULAR | Status: DC | PRN

## 2020-08-17 MED ORDER — SODIUM CHLORIDE 0.9 % IR SOLN
Status: DC | PRN
  Administered 2020-08-17: 1000 mL

## 2020-08-17 MED ORDER — MAGNESIUM CITRATE PO SOLN: 1.0000 | Freq: Once | ORAL | Status: DC

## 2020-08-17 MED ORDER — TRAMADOL HCL 50 MG PO TABS: 50.0000 mg | ORAL_TABLET | Freq: Four times a day (QID) | ORAL | 0 refills | Status: DC | PRN

## 2020-08-17 MED ORDER — PROMETHAZINE HCL 25 MG/ML IJ SOLN: 6.2500 mg | INTRAMUSCULAR | Status: DC | PRN

## 2020-08-17 MED ORDER — PHENYLEPHRINE 40 MCG/ML (10ML) SYRINGE FOR IV PUSH (FOR BLOOD PRESSURE SUPPORT)
PREFILLED_SYRINGE | INTRAVENOUS | Status: AC
  Filled 2020-08-17: qty 10

## 2020-08-17 MED ORDER — ZOLPIDEM TARTRATE 5 MG PO TABS
5.0000 mg | ORAL_TABLET | Freq: Every evening | ORAL | Status: DC | PRN
Start: 2020-08-17 — End: 2020-08-18

## 2020-08-17 MED ORDER — ROCURONIUM BROMIDE 10 MG/ML (PF) SYRINGE
PREFILLED_SYRINGE | INTRAVENOUS | Status: AC
  Filled 2020-08-17: qty 10

## 2020-08-17 MED ORDER — ORAL CARE MOUTH RINSE: 15.0000 mL | Freq: Once | OROMUCOSAL | Status: AC

## 2020-08-17 MED ORDER — MENTHOL 3 MG MT LOZG
1.0000 | LOZENGE | OROMUCOSAL | Status: DC | PRN
  Filled 2020-08-17: qty 9

## 2020-08-17 MED ORDER — ONDANSETRON HCL 4 MG/2ML IJ SOLN
INTRAMUSCULAR | Status: AC
  Filled 2020-08-17: qty 2

## 2020-08-17 MED ORDER — KETAMINE HCL 10 MG/ML IJ SOLN
INTRAMUSCULAR | Status: DC | PRN
  Administered 2020-08-17: 40 mg via INTRAVENOUS

## 2020-08-17 MED ORDER — LACTATED RINGERS IV SOLN: INTRAVENOUS | Status: DC | PRN

## 2020-08-17 MED ORDER — OXYCODONE HCL 5 MG PO TABS
5.0000 mg | ORAL_TABLET | Freq: Once | ORAL | Status: AC | PRN
Start: 2020-08-17 — End: 2020-08-17
  Administered 2020-08-17: 5 mg via ORAL

## 2020-08-17 MED ORDER — MORPHINE SULFATE (PF) 4 MG/ML IV SOLN
INTRAVENOUS | Status: AC
  Administered 2020-08-17: 4 mg via INTRAVENOUS
  Filled 2020-08-17: qty 1

## 2020-08-17 MED ORDER — LIDOCAINE 2% (20 MG/ML) 5 ML SYRINGE
INTRAMUSCULAR | Status: DC | PRN
  Administered 2020-08-17: 80 mg via INTRAVENOUS

## 2020-08-17 MED ORDER — LIDOCAINE 2% (20 MG/ML) 5 ML SYRINGE
INTRAMUSCULAR | Status: AC
  Filled 2020-08-17: qty 5

## 2020-08-17 MED ORDER — SCOPOLAMINE 1 MG/3DAYS TD PT72
MEDICATED_PATCH | TRANSDERMAL | Status: DC | PRN
  Administered 2020-08-17: 1 via TRANSDERMAL

## 2020-08-17 MED ORDER — MIDAZOLAM HCL 2 MG/2ML IJ SOLN
INTRAMUSCULAR | Status: AC
  Filled 2020-08-17: qty 2

## 2020-08-17 MED ORDER — FENTANYL CITRATE (PF) 250 MCG/5ML IJ SOLN
INTRAMUSCULAR | Status: AC
  Filled 2020-08-17: qty 5

## 2020-08-17 MED ORDER — DEXAMETHASONE SODIUM PHOSPHATE 10 MG/ML IJ SOLN
INTRAMUSCULAR | Status: DC | PRN
  Administered 2020-08-17: 10 mg via INTRAVENOUS

## 2020-08-17 MED ORDER — PROPOFOL 10 MG/ML IV BOLUS
INTRAVENOUS | Status: DC | PRN
  Administered 2020-08-17: 200 mg via INTRAVENOUS

## 2020-08-17 MED ORDER — BELLADONNA ALKALOIDS-OPIUM 16.2-60 MG RE SUPP: 1.0000 | Freq: Four times a day (QID) | RECTAL | Status: DC | PRN

## 2020-08-17 MED ORDER — CHLORHEXIDINE GLUCONATE CLOTH 2 % EX PADS
6.0000 | MEDICATED_PAD | Freq: Every day | CUTANEOUS | Status: DC
  Administered 2020-08-18: 6 via TOPICAL

## 2020-08-17 MED ORDER — FLEET ENEMA 7-19 GM/118ML RE ENEM: 1.0000 | ENEMA | Freq: Once | RECTAL | Status: DC

## 2020-08-17 MED ORDER — AMLODIPINE BESYLATE 10 MG PO TABS
10.0000 mg | ORAL_TABLET | Freq: Every day | ORAL | Status: DC
  Administered 2020-08-18: 10 mg via ORAL
  Filled 2020-08-17: qty 1

## 2020-08-17 MED ORDER — ROCURONIUM BROMIDE 10 MG/ML (PF) SYRINGE
PREFILLED_SYRINGE | INTRAVENOUS | Status: DC | PRN
  Administered 2020-08-17: 20 mg via INTRAVENOUS
  Administered 2020-08-17: 30 mg via INTRAVENOUS
  Administered 2020-08-17: 70 mg via INTRAVENOUS

## 2020-08-17 MED ORDER — CEFAZOLIN SODIUM-DEXTROSE 1-4 GM/50ML-% IV SOLN
1.0000 g | Freq: Three times a day (TID) | INTRAVENOUS | Status: AC
  Administered 2020-08-17 – 2020-08-18 (×2): 1 g via INTRAVENOUS
  Filled 2020-08-17 (×2): qty 50

## 2020-08-17 MED ORDER — CEFAZOLIN SODIUM-DEXTROSE 2-4 GM/100ML-% IV SOLN
2.0000 g | Freq: Once | INTRAVENOUS | Status: AC
  Administered 2020-08-17: 2 g via INTRAVENOUS
  Filled 2020-08-17: qty 100

## 2020-08-17 MED ORDER — FENTANYL CITRATE (PF) 250 MCG/5ML IJ SOLN
INTRAMUSCULAR | Status: DC | PRN
  Administered 2020-08-17: 50 ug via INTRAVENOUS
  Administered 2020-08-17 (×2): 100 ug via INTRAVENOUS

## 2020-08-17 MED ORDER — KETAMINE HCL 10 MG/ML IJ SOLN
INTRAMUSCULAR | Status: AC
  Filled 2020-08-17: qty 1

## 2020-08-17 MED ORDER — KCL IN DEXTROSE-NACL 20-5-0.45 MEQ/L-%-% IV SOLN
INTRAVENOUS | Status: DC
  Filled 2020-08-17 (×3): qty 1000

## 2020-08-17 MED ORDER — PHENOL 1.4 % MT LIQD
1.0000 | OROMUCOSAL | Status: DC | PRN
  Filled 2020-08-17: qty 177

## 2020-08-17 MED ORDER — DOCUSATE SODIUM 100 MG PO CAPS: 100.0000 mg | ORAL_CAPSULE | Freq: Two times a day (BID) | ORAL | Status: DC

## 2020-08-17 MED ORDER — PROPOFOL 10 MG/ML IV BOLUS
INTRAVENOUS | Status: AC
  Filled 2020-08-17: qty 40

## 2020-08-17 MED ORDER — PHENYLEPHRINE HCL (PRESSORS) 10 MG/ML IV SOLN
INTRAVENOUS | Status: AC
  Filled 2020-08-17: qty 1

## 2020-08-17 MED ORDER — SCOPOLAMINE 1 MG/3DAYS TD PT72
MEDICATED_PATCH | TRANSDERMAL | Status: AC
  Filled 2020-08-17: qty 1

## 2020-08-17 MED ORDER — DIPHENHYDRAMINE HCL 50 MG/ML IJ SOLN: 12.5000 mg | Freq: Four times a day (QID) | INTRAMUSCULAR | Status: DC | PRN

## 2020-08-17 MED ORDER — BUPIVACAINE-EPINEPHRINE 0.25% -1:200000 IJ SOLN
INTRAMUSCULAR | Status: DC | PRN
  Administered 2020-08-17: 30 mL

## 2020-08-17 MED ORDER — ACETAMINOPHEN 325 MG PO TABS: 650.0000 mg | ORAL_TABLET | ORAL | Status: DC | PRN

## 2020-08-17 MED ORDER — PHENYLEPHRINE HCL-NACL 10-0.9 MG/250ML-% IV SOLN
INTRAVENOUS | Status: AC
  Filled 2020-08-17: qty 250

## 2020-08-17 MED ORDER — MORPHINE SULFATE (PF) 4 MG/ML IV SOLN
2.0000 mg | INTRAVENOUS | Status: DC | PRN
  Administered 2020-08-17: 4 mg via INTRAVENOUS
  Filled 2020-08-17: qty 1

## 2020-08-17 MED ORDER — KETOROLAC TROMETHAMINE 15 MG/ML IJ SOLN
15.0000 mg | Freq: Four times a day (QID) | INTRAMUSCULAR | Status: DC
  Administered 2020-08-17 – 2020-08-18 (×3): 15 mg via INTRAVENOUS
  Filled 2020-08-17 (×3): qty 1

## 2020-08-17 MED ORDER — BUPIVACAINE-EPINEPHRINE (PF) 0.25% -1:200000 IJ SOLN
INTRAMUSCULAR | Status: AC
  Filled 2020-08-17: qty 30

## 2020-08-17 MED ORDER — DEXAMETHASONE SODIUM PHOSPHATE 10 MG/ML IJ SOLN
INTRAMUSCULAR | Status: AC
  Filled 2020-08-17: qty 1

## 2020-08-17 MED ORDER — FENTANYL CITRATE (PF) 100 MCG/2ML IJ SOLN: 25.0000 ug | INTRAMUSCULAR | Status: DC | PRN

## 2020-08-17 MED ORDER — EPHEDRINE SULFATE 50 MG/ML IJ SOLN
INTRAMUSCULAR | Status: DC | PRN
  Administered 2020-08-17: 10 mg via INTRAVENOUS
  Administered 2020-08-17: 5 mg via INTRAVENOUS
  Administered 2020-08-17: 15 mg via INTRAVENOUS
  Administered 2020-08-17: 10 mg via INTRAVENOUS

## 2020-08-17 MED ORDER — COLCHICINE 0.6 MG PO TABS: 0.6000 mg | ORAL_TABLET | Freq: Two times a day (BID) | ORAL | Status: DC | PRN

## 2020-08-17 MED ORDER — LACTATED RINGERS IV SOLN: INTRAVENOUS | Status: DC

## 2020-08-17 MED ORDER — SUGAMMADEX SODIUM 200 MG/2ML IV SOLN
INTRAVENOUS | Status: DC | PRN
  Administered 2020-08-17: 400 mg via INTRAVENOUS

## 2020-08-17 MED ORDER — CHLORHEXIDINE GLUCONATE 0.12 % MT SOLN
15.0000 mL | Freq: Once | OROMUCOSAL | Status: AC
  Administered 2020-08-17: 15 mL via OROMUCOSAL

## 2020-08-17 MED ORDER — DOCUSATE SODIUM 100 MG PO CAPS
100.0000 mg | ORAL_CAPSULE | Freq: Two times a day (BID) | ORAL | Status: DC
  Administered 2020-08-17 – 2020-08-18 (×2): 100 mg via ORAL
  Filled 2020-08-17 (×2): qty 1

## 2020-08-17 MED ORDER — ALLOPURINOL 100 MG PO TABS
100.0000 mg | ORAL_TABLET | Freq: Every day | ORAL | Status: DC
  Administered 2020-08-18: 100 mg via ORAL
  Filled 2020-08-17: qty 1

## 2020-08-17 MED ORDER — SULFAMETHOXAZOLE-TRIMETHOPRIM 800-160 MG PO TABS: 1.0000 | ORAL_TABLET | Freq: Two times a day (BID) | ORAL | 0 refills | Status: DC

## 2020-08-17 MED ORDER — NEOSTIGMINE METHYLSULFATE 3 MG/3ML IV SOSY
PREFILLED_SYRINGE | INTRAVENOUS | Status: AC
  Filled 2020-08-17: qty 3

## 2020-08-17 MED ORDER — CARVEDILOL 25 MG PO TABS
25.0000 mg | ORAL_TABLET | Freq: Two times a day (BID) | ORAL | Status: DC
  Administered 2020-08-17 – 2020-08-18 (×2): 25 mg via ORAL
  Filled 2020-08-17 (×2): qty 1

## 2020-08-17 MED ORDER — OXYCODONE HCL 5 MG PO TABS
ORAL_TABLET | ORAL | Status: AC
  Filled 2020-08-17: qty 1

## 2020-08-17 MED ORDER — DIPHENHYDRAMINE HCL 12.5 MG/5ML PO ELIX: 12.5000 mg | ORAL_SOLUTION | Freq: Four times a day (QID) | ORAL | Status: DC | PRN

## 2020-08-17 MED ORDER — MIDAZOLAM HCL 5 MG/5ML IJ SOLN
INTRAMUSCULAR | Status: DC | PRN
  Administered 2020-08-17: 2 mg via INTRAVENOUS

## 2020-08-17 MED ORDER — BACITRACIN-NEOMYCIN-POLYMYXIN 400-5-5000 EX OINT: 1.0000 "application " | TOPICAL_OINTMENT | Freq: Three times a day (TID) | CUTANEOUS | Status: DC | PRN

## 2020-08-17 MED ORDER — OXYCODONE HCL 5 MG/5ML PO SOLN: 5.0000 mg | Freq: Once | ORAL | Status: AC | PRN

## 2020-08-17 SURGICAL SUPPLY — 60 items
APPLICATOR COTTON TIP 6 STRL (MISCELLANEOUS) ×2 IMPLANT
APPLICATOR COTTON TIP 6IN STRL (MISCELLANEOUS) ×3
BAG COUNTER SPONGE SURGICOUNT (BAG) ×3 IMPLANT
CATH FOLEY 2WAY SLVR 18FR 30CC (CATHETERS) ×3 IMPLANT
CATH ROBINSON RED A/P 16FR (CATHETERS) ×3 IMPLANT
CATH ROBINSON RED A/P 8FR (CATHETERS) ×3 IMPLANT
CATH TIEMANN FOLEY 18FR 5CC (CATHETERS) ×3 IMPLANT
CHLORAPREP W/TINT 26 (MISCELLANEOUS) ×3 IMPLANT
CLIP VESOLOCK LG 6/CT PURPLE (CLIP) ×9 IMPLANT
COVER SURGICAL LIGHT HANDLE (MISCELLANEOUS) ×3 IMPLANT
COVER TIP SHEARS 8 DVNC (MISCELLANEOUS) ×2 IMPLANT
COVER TIP SHEARS 8MM DA VINCI (MISCELLANEOUS) ×1
CUTTER ECHEON FLEX ENDO 45 340 (ENDOMECHANICALS) ×3 IMPLANT
DERMABOND ADVANCED (GAUZE/BANDAGES/DRESSINGS) ×1
DERMABOND ADVANCED .7 DNX12 (GAUZE/BANDAGES/DRESSINGS) ×2 IMPLANT
DRAIN CHANNEL RND F F (WOUND CARE) IMPLANT
DRAPE ARM DVNC X/XI (DISPOSABLE) ×8 IMPLANT
DRAPE COLUMN DVNC XI (DISPOSABLE) ×2 IMPLANT
DRAPE DA VINCI XI ARM (DISPOSABLE) ×4
DRAPE DA VINCI XI COLUMN (DISPOSABLE) ×1
DRAPE SURG IRRIG POUCH 19X23 (DRAPES) ×3 IMPLANT
DRSG TEGADERM 4X4.75 (GAUZE/BANDAGES/DRESSINGS) ×3 IMPLANT
ELECT PENCIL ROCKER SW 15FT (MISCELLANEOUS) ×3 IMPLANT
ELECT REM PT RETURN 15FT ADLT (MISCELLANEOUS) ×3 IMPLANT
GAUZE 4X4 16PLY ~~LOC~~+RFID DBL (SPONGE) ×3 IMPLANT
GLOVE SURG ENC MOIS LTX SZ6.5 (GLOVE) ×3 IMPLANT
GLOVE SURG ENC TEXT LTX SZ7.5 (GLOVE) ×6 IMPLANT
GOWN STRL REUS W/TWL LRG LVL3 (GOWN DISPOSABLE) ×9 IMPLANT
GOWN STRL REUS W/TWL XL LVL3 (GOWN DISPOSABLE) ×3 IMPLANT
HOLDER FOLEY CATH W/STRAP (MISCELLANEOUS) ×3 IMPLANT
IRRIG SUCT STRYKERFLOW 2 WTIP (MISCELLANEOUS) ×3
IRRIGATION SUCT STRKRFLW 2 WTP (MISCELLANEOUS) ×2 IMPLANT
KIT TURNOVER KIT A (KITS) ×3 IMPLANT
NDL SAFETY ECLIPSE 18X1.5 (NEEDLE) ×2 IMPLANT
NEEDLE HYPO 18GX1.5 SHARP (NEEDLE) ×1
PACK ROBOT UROLOGY CUSTOM (CUSTOM PROCEDURE TRAY) ×3 IMPLANT
PENCIL SMOKE EVACUATOR (MISCELLANEOUS) IMPLANT
SEAL CANN UNIV 5-8 DVNC XI (MISCELLANEOUS) ×8 IMPLANT
SEAL XI 5MM-8MM UNIVERSAL (MISCELLANEOUS) ×4
SET TUBE SMOKE EVAC HIGH FLOW (TUBING) ×3 IMPLANT
SOLUTION ELECTROLUBE (MISCELLANEOUS) ×3 IMPLANT
STAPLE RELOAD 45 GRN (STAPLE) ×2 IMPLANT
STAPLE RELOAD 45MM GREEN (STAPLE) ×1
SUT ETHILON 3 0 PS 1 (SUTURE) ×3 IMPLANT
SUT MNCRL 3 0 RB1 (SUTURE) ×2 IMPLANT
SUT MNCRL 3 0 VIOLET RB1 (SUTURE) ×2 IMPLANT
SUT MNCRL AB 4-0 PS2 18 (SUTURE) ×6 IMPLANT
SUT MONOCRYL 3 0 RB1 (SUTURE) ×2
SUT VIC AB 0 CT1 27 (SUTURE) ×1
SUT VIC AB 0 CT1 27XBRD ANTBC (SUTURE) ×2 IMPLANT
SUT VIC AB 0 UR5 27 (SUTURE) ×3 IMPLANT
SUT VIC AB 2-0 SH 27 (SUTURE) ×1
SUT VIC AB 2-0 SH 27X BRD (SUTURE) ×2 IMPLANT
SUT VIC AB 3-0 SH 27 (SUTURE) ×1
SUT VIC AB 3-0 SH 27XBRD (SUTURE) ×2 IMPLANT
SUT VICRYL 0 UR6 27IN ABS (SUTURE) ×6 IMPLANT
SYR 27GX1/2 1ML LL SAFETY (SYRINGE) ×3 IMPLANT
TOWEL OR NON WOVEN STRL DISP B (DISPOSABLE) ×3 IMPLANT
TROCAR XCEL NON-BLD 5MMX100MML (ENDOMECHANICALS) ×3 IMPLANT
WATER STERILE IRR 1000ML POUR (IV SOLUTION) ×3 IMPLANT

## 2020-08-17 NOTE — Op Note (Signed)
Preoperative diagnosis: Clinically localized adenocarcinoma of the prostate (clinical stage T1c N0 M0)  Postoperative diagnosis: Clinically localized adenocarcinoma of the prostate (clinical stage T1c N0 M0)  Procedure:  Robotic assisted laparoscopic radical prostatectomy (non nerve sparing) Bilateral robotic assisted laparoscopic pelvic lymphadenectomy  Surgeon: Pryor Curia. M.D.  Assistant(s): Debbrah Alar, PA-C  Resident: Dr. Bishop Limbo  Anesthesia: General  Complications: None  EBL: 100 mL  IVF:  1600 mL crystalloid  Specimens: Prostate and seminal vesicles Right pelvic lymph nodes Left pelvic lymph nodes  Disposition of specimens: Pathology  Drains: 20 Fr coude catheter # 19 Blake pelvic drain  Indication: Justin Elliott is a 55 y.o. year old patient with clinically localized prostate cancer.  After a thorough review of the management options for treatment of prostate cancer, he elected to proceed with surgical therapy and the above procedure(s).  We have discussed the potential benefits and risks of the procedure, side effects of the proposed treatment, the likelihood of the patient achieving the goals of the procedure, and any potential problems that might occur during the procedure or recuperation. Informed consent has been obtained.  Description of procedure:  The patient was taken to the operating room and a general anesthetic was administered. He was given preoperative antibiotics, placed in the dorsal lithotomy position, and prepped and draped in the usual sterile fashion. Next a preoperative timeout was performed. A urethral catheter was placed into the bladder and a site was selected near the umbilicus for placement of the camera port. This was placed using a standard open Hassan technique which allowed entry into the peritoneal cavity under direct vision and without difficulty. A 12 mm port was placed and a pneumoperitoneum established. The camera  was then used to inspect the abdomen and there was no evidence of any intra-abdominal injuries or other abnormalities. The remaining abdominal ports were then placed. 8 mm robotic ports were placed in the right lower quadrant, left lower quadrant, and far left lateral abdominal wall. A 5 mm port was placed in the right upper quadrant and a 12 mm port was placed in the right lateral abdominal wall for laparoscopic assistance. All ports were placed under direct vision without difficulty. The surgical cart was then docked.   Utilizing the cautery scissors, the bladder was reflected posteriorly allowing entry into the space of Retzius and identification of the endopelvic fascia and prostate. The periprostatic fat was then removed from the prostate allowing full exposure of the endopelvic fascia. The endopelvic fascia was then incised from the apex back to the base of the prostate bilaterally and the underlying levator muscle fibers were swept laterally off the prostate thereby isolating the dorsal venous complex. The dorsal vein was then stapled and divided with a 45 mm Flex Echelon stapler. Attention then turned to the bladder neck which was divided anteriorly thereby allowing entry into the bladder and exposure of the urethral catheter. The catheter balloon was deflated and the catheter was brought into the operative field and used to retract the prostate anteriorly. The posterior bladder neck was then examined and was divided allowing further dissection between the bladder and prostate posteriorly until the vasa deferentia and seminal vessels were identified. The vasa deferentia were isolated, divided, and lifted anteriorly. The seminal vesicles were dissected down to their tips with care to control the seminal vascular arterial blood supply. These structures were then lifted anteriorly and the space between Denonvillier's fascia and the anterior rectum was developed with a combination of sharp  and blunt  dissection. This isolated the vascular pedicles of the prostate.  A wide non nerve sparing dissection was performed with Weck clips used to ligate the vascular pedicles of the prostate bilaterally. The vascular pedicles of the prostate were then divided.  The urethra was then sharply transected allowing the prostate specimen to be disarticulated. The pelvis was copiously irrigated and hemostasis was ensured. There was no evidence for rectal injury.  Attention then turned to the right pelvic sidewall. The fibrofatty tissue between the external iliac vein, confluence of the iliac vessels, hypogastric artery, and Cooper's ligament was dissected free from the pelvic sidewall with care to preserve the obturator nerve. Weck clips were used for lymphostasis and hemostasis. An identical procedure was performed on the contralateral side and the lymphatic packets were removed for permanent pathologic analysis.  Attention then turned to the urethral anastomosis. A 2-0 Vicryl slip knot was placed between Denonvillier's fascia, the posterior bladder neck, and the posterior urethra to reapproximate these structures. A double-armed 3-0 Monocryl suture was then used to perform a 360 running tension-free anastomosis between the bladder neck and urethra. A new urethral catheter was then placed into the bladder and irrigated. There were no blood clots within the bladder and the anastomosis appeared to be watertight. A #19 Blake drain was then brought through the left lateral 8 mm port site and positioned appropriately within the pelvis. It was secured to the skin with a nylon suture. The surgical cart was then undocked. The right lateral 12 mm port site was closed at the fascial level with a 0 Vicryl suture placed laparoscopically. All remaining ports were then removed under direct vision. The prostate specimen was removed intact within the Endopouch retrieval bag via the periumbilical camera port site. This fascial opening  was closed with two running 0 Vicryl sutures. 0.25% Marcaine was then injected into all port sites and all incisions were reapproximated at the skin level with 3-0 Monocryl subcuticular sutures. Dermabond was applied to the skin. The patient appeared to tolerate the procedure well and without complications. The patient was able to be extubated and transferred to the recovery unit in satisfactory condition.  Pryor Curia MD

## 2020-08-17 NOTE — Progress Notes (Signed)
Patient ID: Justin Elliott, male   DOB: [birth year], 55 y.o.   MRN: [id number]  Post-op note  Subjective: The patient is doing well.  No complaints except for sore throat.  Objective: Vital signs in last 24 hours: Temp:  [97.3 F (36.3 C)-98.3 F (36.8 C)] 97.7 F (36.5 C) (07/11 1145) Pulse Rate:  [68-95] 95 (07/11 1600) Resp:  [14-26] 22 (07/11 1500) BP: (102-174)/(68-103) 174/103 (07/11 1600) SpO2:  [90 %-100 %] 94 % (07/11 1600)  Intake/Output from previous day: No intake/output data recorded. Intake/Output this shift: Total I/O In: 1700 [I.V.:1600; IV Piggyback:100] Out: 1400 [Urine:1150; Drains:150; Blood:100]  Physical Exam:  General: Alert and oriented. Abdomen: Soft, Nondistended. Incisions: Clean and dry. GU: Urine clearing.  Lab Results: Recent Labs    08/17/20 1109  HGB 13.3  HCT 41.1    Assessment/Plan: POD#0   1) Continue to monitor, ambulate, IS   Justin Elliott. MD   LOS: 0 days   Justin Elliott 08/17/2020, 4:44 PM

## 2020-08-17 NOTE — Discharge Summary (Signed)
Date of admission: 08/17/2020  Date of discharge: 08/18/2020  Admission diagnosis: Prostate Cancer  Discharge diagnosis: Prostate Cancer  History and Physical: For full details, please see admission history and physical. Briefly, Justin Elliott is a 55 y.o. gentleman with localized prostate cancer.  After discussing management/treatment options, he elected to proceed with surgical treatment.  Hospital Course: Justin Elliott was taken to the operating room on 08/17/2020 and underwent a robotic assisted laparoscopic radical prostatectomy. He tolerated this procedure well and without complications. Postoperatively, he was able to be transferred to a regular hospital room following recovery from anesthesia.  He was able to begin ambulating the night of surgery. He remained hemodynamically stable overnight.  He had excellent urine output with appropriately minimal output from his pelvic drain and his pelvic drain was removed on POD #1.  He was transitioned to oral pain medication, tolerated a clear liquid diet, and had met all discharge criteria and was able to be discharged home later on POD#1.  Laboratory values:  Recent Labs    08/17/20 1109 08/18/20 0421  HGB 13.3 13.6  HCT 41.1 41.4    Disposition: Home  Discharge instruction: He was instructed to be ambulatory but to refrain from heavy lifting, strenuous activity, or driving. He was instructed on urethral catheter care.  Discharge medications:   Allergies as of 08/18/2020       Reactions   Losartan    Sore throat, no lip-tongue swelling        Medication List     TAKE these medications    allopurinol 100 MG tablet Commonly known as: ZYLOPRIM Take 1 tablet (100 mg total) by mouth daily.   amLODipine 10 MG tablet Commonly known as: NORVASC Take 1 tablet (10 mg total) by mouth daily.   carvedilol 25 MG tablet Commonly known as: COREG Take 1 tablet (25 mg total) by mouth 2 (two) times daily with a meal.   colchicine 0.6  MG tablet TAKE 1 TABLET (0.6 MG TOTAL) BY MOUTH 2 (TWO) TIMES DAILY AS NEEDED (GOUT).   desonide 0.05 % lotion Commonly known as: DESOWEN Apply topically as needed.   docusate sodium 100 MG capsule Commonly known as: COLACE Take 1 capsule (100 mg total) by mouth 2 (two) times daily.   sulfamethoxazole-trimethoprim 800-160 MG tablet Commonly known as: BACTRIM DS Take 1 tablet by mouth 2 (two) times daily. Start the day prior to foley removal appointment   traMADol 50 MG tablet Commonly known as: Ultram Take 1-2 tablets (50-100 mg total) by mouth every 6 (six) hours as needed for moderate pain or severe pain.        Followup: He will followup in 1 week for catheter removal and to discuss his surgical pathology results.

## 2020-08-17 NOTE — Interval H&P Note (Signed)
History and Physical Interval Note:  08/17/2020 6:56 AM  Justin Elliott  has presented today for surgery, with the diagnosis of PROSTATE CANCER.  The various methods of treatment have been discussed with the patient and family. After consideration of risks, benefits and other options for treatment, the patient has consented to  Procedure(s): XI ROBOTIC ASSISTED LAPAROSCOPIC RADICAL PROSTATECTOMY LEVEL 2 (N/A) LYMPHADENECTOMY, PELVIC (Bilateral) as a surgical intervention.  The patient's history has been reviewed, patient examined, no change in status, stable for surgery.  He has been cleared by cardiology to proceed with low risk.  I have reviewed the patient's chart and labs.  Questions were answered to the patient's satisfaction.     [employer]

## 2020-08-17 NOTE — Anesthesia Procedure Notes (Signed)
Procedure Name: Intubation Date/Time: 08/17/2020 7:27 AM Performed by: Talbot Grumbling, CRNA Pre-anesthesia Checklist: Patient identified, Suction available and Patient being monitored Patient Re-evaluated:Patient Re-evaluated prior to induction Oxygen Delivery Method: Circle system utilized Preoxygenation: Pre-oxygenation with 100% oxygen Induction Type: IV induction Ventilation: Mask ventilation without difficulty Laryngoscope Size: Mac and 3 Grade View: Grade I Tube type: Oral Tube size: 8.0 mm Number of attempts: 1 Airway Equipment and Method: Stylet Placement Confirmation: positive ETCO2, CO2 detector, ETT inserted through vocal cords under direct vision and breath sounds checked- equal and bilateral Secured at: 23 cm Tube secured with: Tape Dental Injury: Teeth and Oropharynx as per pre-operative assessment

## 2020-08-17 NOTE — Discharge Instructions (Signed)

## 2020-08-17 NOTE — Telephone Encounter (Signed)
[facility] called requesting office visit notes from this patient's appointment 08/14/20. The patient is scheduled for surgery today.   Please fax records to [phone] Attn: Dr. Irine Seal

## 2020-08-17 NOTE — Anesthesia Postprocedure Evaluation (Signed)
Anesthesia Post Note  Patient: Justin Elliott  Procedure(s) Performed: XI ROBOTIC ASSISTED LAPAROSCOPIC RADICAL PROSTATECTOMY LEVEL 2 LYMPHADENECTOMY, PELVIC (Bilateral)     Patient location during evaluation: PACU Anesthesia Type: General Level of consciousness: awake and alert Pain management: pain level controlled Vital Signs Assessment: post-procedure vital signs reviewed and stable Respiratory status: spontaneous breathing, nonlabored ventilation, respiratory function stable and patient connected to nasal cannula oxygen Cardiovascular status: blood pressure returned to baseline and stable Postop Assessment: no apparent nausea or vomiting Anesthetic complications: no   No notable events documented.  Last Vitals:  Vitals:   08/17/20 1130 08/17/20 1145  BP: 140/86 134/88  Pulse: 80 80  Resp: 18 (!) 26  Temp:  36.5 C  SpO2: 92% 94%    Last Pain:  Vitals:   08/17/20 0550  TempSrc: Oral                 Audry Pili

## 2020-08-17 NOTE — H&P (Signed)
Office Visit Report     07/31/2020   --------------------------------------------------------------------------------   Justin Elliott  MRN: [id number]  DOB: 1965/10/12, 55 year old Male  SSN:    PRIMARY CARE:  Kathlene November, MD  REFERRING:  Kathlene November, MD  PROVIDER:  Irine Seal, M.D.  TREATING:  Jiles Crocker, NP  LOCATION:  [facility] [phone]     --------------------------------------------------------------------------------   CC/HPI: CC: Prostate Cancer   Physician requesting consult: Dr. Irine Seal  PCP: Dr. Kathlene November  Location of consult: [facility]   [name] is a 55 year old gentleman with a past medical history significant for hypertension, gout, hyperlipidemia, and arthritis. He was found to have an elevated PSA of 8.33 prompting an evaluation and TRUS biopsy of the prostate on 05/20/20 that confirmed Gleason 4+5=9 adenocarcinoma of the prostate with 7 out of 12 biopsy cores positive for malignancy.   Family history: He has a father with a history of prostate cancer and breast cancer and a sister with a history of breast cancer. His father died at age 52 and was on what sounds like androgen deprivation when he died but did not die of prostate cancer. His sister did die of metastatic breast cancer in her 87s.   Imaging studies:  Bone scan (06/02/20): Negative for metastatic disease.  CT abdomen/pelvis (06/05/20): Negative for metastatic disease. Small right lower lobe pulmonary nodules.   PMH: He has a history of gout, hypertension, hyperlipidemia, and arthritis.  PSH: No abdominal surgeries.   TNM stage: cT1c N0 M0  PSA: 8.33  Gleason score: 4+5=9 (GG 5)  Biopsy (05/20/20): 7/12 cores positive  Left: L apex (10%, 3+3=6), L mid (60%, 4+5=9), L lateral base (90%, 4+5=9), L base (60%, 4+5=9)  Right: R lateral apex (60%, 3+4=7), R base (90%, 4+5=9, PNI), R lateral base (80%, 4+5=9)  Prostate  volume: 37.0 cc   Nomogram  OC disease: 17%  EPE: 80%  SVI: 35%  LNI: 30%  PFS (5 year, 10 year): 42%, 28%   Urinary function: IPSS is 8.  Erectile function: SHIM score is 20.   07/31/2020: Here today for pre-op visit prior to undergoing bilateral nerve-sparing robot assisted laparoscopic radical prostatectomy and bilateral pelvic lymphadenectomy on 07/11 with Dr Alinda Money. He denies any interval changes to past medical history, prescription medications taken on a daily basis. No interval surgical or procedural intervention. He denies recent or interval infection treatment. He continues to void at his baseline with stable grossly non bothersome symptomatology. Typically gets up only once per night. He denies changes in force of stream, increased daytime frequency/urgency, dysuria or gross hematuria. He has already met with pelvic floor physical therapy for 2 visits. He has been diligent with the exercises already talked to him but those appointments. He has had no recent fevers or chills, nausea/vomiting. He endorses normal daily bowel movements. He has had no chest pain, shortness of breath, paresthesias, lightheadedness or dizziness.     ALLERGIES: None   MEDICATIONS: Amlodipine Besylate 10 mg tablet  Carvedilol 25 mg tablet  Colchicine 0.6 mg tablet  Levofloxacin 750 mg tablet 1 po 1 hour prior to the procedure     GU PSH: Locm 300-399Mg/Ml Iodine,1Ml - 06/05/2020 Prostate Needle Biopsy - 05/20/2020       PSH Notes: finger surgery-1984   NON-GU PSH: Surgical Pathology, Gross And Microscopic Examination For Prostate Needle - 05/20/2020     GU PMH: Prostate Cancer -  07/27/2020, - 07/03/2020, - 06/19/2020 Elevated PSA - 06/05/2020, - 05/20/2020, - 03/30/2020 BPH w/o LUTS - 03/30/2020    NON-GU PMH: Muscle weakness (generalized) - 07/27/2020, - 07/03/2020 Other muscle spasm - 07/27/2020, - 07/03/2020 Other specified disorders of muscle - 07/27/2020, -  07/03/2020 Arthritis Gout Hypercholesterolemia Hypertension    FAMILY HISTORY: Breast Cancer - Sister heart failure - Father Prostate Cancer - Father    Notes: 1 son; 1 daughter    SOCIAL HISTORY: Marital Status: Married Preferred Language: English; Ethnicity: Not Hispanic Or Latino; Race: Black or African American Current Smoking Status: Patient has never smoked.   Tobacco Use Assessment Completed: Used Tobacco in last 30 days? Does not use smokeless tobacco. Has never drank.  Drinks 2 caffeinated drinks per day. Patient's occupation Tour manager.    REVIEW OF SYSTEMS:    GU Review Male:   Patient reports get up at night to urinate. Patient denies frequent urination, hard to postpone urination, burning/ pain with urination, leakage of urine, stream starts and stops, trouble starting your stream, have to strain to urinate , erection problems, and penile pain.  Gastrointestinal (Upper):   Patient denies nausea, vomiting, and indigestion/ heartburn.  Gastrointestinal (Lower):   Patient denies diarrhea and constipation.  Constitutional:   Patient denies fever, night sweats, weight loss, and fatigue.  Skin:   Patient denies skin rash/ lesion and itching.  Eyes:   Patient denies blurred vision and double vision.  Ears/ Nose/ Throat:   Patient denies sore throat and sinus problems.  Hematologic/Lymphatic:   Patient denies swollen glands and easy bruising.  Cardiovascular:   Patient denies leg swelling and chest pains.  Respiratory:   Patient denies shortness of breath and cough.  Endocrine:   Patient denies excessive thirst.  Musculoskeletal:   Patient denies back pain and joint pain.  Neurological:   Patient denies headaches and dizziness.  Psychologic:   Patient denies depression and anxiety.   VITAL SIGNS:      07/31/2020 01:40 PM  BP 169/123 mmHg  Heart Rate 82 /min  Temperature 98.2 F / 36.7 C   MULTI-SYSTEM PHYSICAL EXAMINATION:    Constitutional: Well-nourished. No  physical deformities. Normally developed. Good grooming.  Neck: Neck symmetrical, not swollen. Normal tracheal position.  Respiratory: No labored breathing, no use of accessory muscles.   Cardiovascular: Normal temperature, normal extremity pulses, no swelling, no varicosities.  Skin: No paleness, no jaundice, no cyanosis. No lesion, no ulcer, no rash.  Neurologic / Psychiatric: Oriented to time, oriented to place, oriented to person. No depression, no anxiety, no agitation.  Gastrointestinal: No mass, no tenderness, no rigidity, non obese abdomen.  Musculoskeletal: Normal gait and station of head and neck.     Complexity of Data:  Source Of History:  Patient, Medical Record Summary  Lab Test Review:   PSA  Records Review:   Pathology Reports, Previous Doctor Records, Previous Hospital Records, Previous Patient Records  Urine Test Review:   Urinalysis   02/21/20 09/20/19 07/10/17 06/16/15 03/07/14  PSA  Total PSA 8.33 ng/ml 5.84 ng/ml 1.97 ng/ml 1.74 ng/ml 2.69 ng/ml    07/31/20  Urinalysis  Urine Appearance Clear   Urine Color Yellow   Urine Glucose Neg mg/dL  Urine Bilirubin Neg mg/dL  Urine Ketones Neg mg/dL  Urine Specific Gravity 1.025   Urine Blood Neg ery/uL  Urine pH 5.5   Urine Protein Trace mg/dL  Urine Urobilinogen 0.2 mg/dL  Urine Nitrites Neg   Urine Leukocyte Esterase Neg leu/uL  PROCEDURES:          Urinalysis Dipstick Dipstick Cont'd  Color: Yellow Bilirubin: Neg mg/dL  Appearance: Clear Ketones: Neg mg/dL  Specific Gravity: 1.025 Blood: Neg ery/uL  pH: 5.5 Protein: Trace mg/dL  Glucose: Neg mg/dL Urobilinogen: 0.2 mg/dL    Nitrites: Neg    Leukocyte Esterase: Neg leu/uL    ASSESSMENT:      ICD-10 Details  1 GU:   Prostate Cancer - C61 Chronic, Threat to Bodily Function   PLAN:           Orders Labs Urine Culture          Schedule Return Visit/Planned Activity: Keep Scheduled Appointment - Follow up MD, Schedule Surgery           Document Letter(s):  Created for Patient: Clinical Summary         Notes:   He typically has good control of his HTN maintained on current antihypertensive therapy. He had a travel here from [place] today to make his appointment and states driving and traffic was quite stressful. He neglected to take his blood pressure medication earlier today but plans on getting back on his normal medication routine after today's appointment. I am not overly concerned about the elevated blood pressure noted at today's visit. He will keep close check of this at home as he does have an at home monitoring device.   All questions answered to the best of my ability about upcoming procedure and expected postoperative course with understanding expressed by the patient. Precautionary urine culture sent today to serve as baseline testing for his upcoming procedure. He will proceed with robotic prostatectomy with Dr. Alinda Money on 08/17/20.        Next Appointment:      Next Appointment: 08/17/2020 07:15 AM    Appointment Type: Surgery     Location: [facility] [phone]    Provider: Raynelle Bring, M.D.    Reason for Visit: [facility]/EXT REC RA LAP RAD PROSTATECTOMY ,BPLA WITH AMANDA      * Signed by Jiles Crocker, NP on 07/31/20 at 2:13 PM (EDT)*       APPENDED NOTES:  I reviewed his MRI indicating broad capsular contact with his tumor. This does raise concern for extraprostatic disease and there is possible seminal vesicle involvement and neurovascular Bundle involvement. As such, I have recommended proceeding with a non nerve sparing prostatectomy. In addition, he was noted to have an abnormal EKG preoperatively. As such, he will be required to undergo cardiology evaluation before proceeding with surgery. A referral has been placed. I spoke with him this evening to review all of the above information. We will try to proceed expediently once cardiology has cleared him.     * Signed by Raynelle Bring,  M.D. on 08/12/20 at 4:34 PM (EDT)*

## 2020-08-17 NOTE — Transfer of Care (Addendum)
Immediate Anesthesia Transfer of Care Note  Patient: Justin Elliott  Procedure(s) Performed: XI ROBOTIC ASSISTED LAPAROSCOPIC RADICAL PROSTATECTOMY LEVEL 2 LYMPHADENECTOMY, PELVIC (Bilateral)  Patient Location: PACU  Anesthesia Type:General  Level of Consciousness: awake  Airway & Oxygen Therapy: Patient Spontanous Breathing and Patient connected to face mask oxygen  Post-op Assessment: Report given to RN, Post -op Vital signs reviewed and stable and Patient moving all extremities X 4  Post vital signs: Reviewed and stable  Last Vitals:  Vitals Value Taken Time  BP 102/68   Temp    Pulse 68   Resp 19 08/17/20 1058  SpO2 100   Vitals shown include unvalidated device data.  Last Pain:  Vitals:   08/17/20 0550  TempSrc: Oral         Complications: No notable events documented.

## 2020-08-18 ENCOUNTER — Encounter (HOSPITAL_COMMUNITY): Payer: Self-pay | Admitting: Urology

## 2020-08-18 DIAGNOSIS — C61 Malignant neoplasm of prostate: Secondary | ICD-10-CM | POA: Diagnosis not present

## 2020-08-18 LAB — HEMOGLOBIN AND HEMATOCRIT, BLOOD
HCT: 41.4 % (ref 39.0–52.0)
Hemoglobin: 13.6 g/dL (ref 13.0–17.0)

## 2020-08-18 MED ORDER — BISACODYL 10 MG RE SUPP
10.0000 mg | Freq: Once | RECTAL | Status: AC
  Administered 2020-08-18: 10 mg via RECTAL
  Filled 2020-08-18: qty 1

## 2020-08-18 MED ORDER — TRAMADOL HCL 50 MG PO TABS
50.0000 mg | ORAL_TABLET | Freq: Four times a day (QID) | ORAL | Status: DC | PRN
  Administered 2020-08-18: 50 mg via ORAL
  Filled 2020-08-18: qty 1

## 2020-08-18 NOTE — Progress Notes (Signed)
Urology Progress Note   1 Day Post-Op from robotic prostatectomy and pelvic lymph node dissection with Dr. Alinda Money.   Subjective: NAEON.  Sore throat pain and abdominal pain well controlled.  3.5 L of urine over the past 24 hours.  250 out from his JP drain with 100 overnight.  Has been ambulating.  Tolerating clears.  Objective: Vital signs in last 24 hours: Temp:  [97.3 F (36.3 C)-98.9 F (37.2 C)] 98.8 F (37.1 C) (07/12 0450) Pulse Rate:  [68-102] 74 (07/12 0450) Resp:  [14-26] 18 (07/12 0450) BP: (102-174)/(68-103) 138/89 (07/12 0450) SpO2:  [90 %-100 %] 98 % (07/12 0450) Weight:  [105.2 kg] 105.2 kg (07/11 2100)  Intake/Output from previous day: 07/11 0701 - 07/12 0700 In: 3481.9 [P.O.:60; I.V.:3271.9; IV Piggyback:150] Out: 1517 [Urine:3500; Drains:250; Blood:100] Intake/Output this shift: Total I/O In: 1648.2 [P.O.:60; I.V.:1588.2] Out: 1900 [Urine:1800; Drains:100]  Physical Exam:  General: Alert and oriented CV: Regular rate Lungs: No increased work of breathing Abdomen:  Soft, appropriately tender. Incisions c/d/i. JP SS GU: Foley in place draining clear yellow urine  Ext: NT, No erythema  Lab Results: Recent Labs    08/17/20 1109 08/18/20 0421  HGB 13.3 13.6  HCT 41.1 41.4   No results for input(s): NA, K, CL, CO2, GLUCOSE, BUN, CREATININE, CALCIUM in the last 72 hours.  Studies/Results: No results found.  Assessment/Plan:  55 y.o. male s/p robotic prostatectomy.  Overall doing well post-op.   -We will stop IV fluids today, continue to ambulate, trial pain control with oral medications   Dispo: Likely discharging home today   LOS: 0 days

## 2020-08-18 NOTE — Plan of Care (Signed)
  Problem: Education: Goal: Knowledge of General Education information will improve Description: Including pain rating scale, medication(s)/side effects and non-pharmacologic comfort measures Outcome: Progressing   Problem: Health Behavior/Discharge Planning: Goal: Ability to manage health-related needs will improve Outcome: Progressing   Problem: Clinical Measurements: Goal: Will remain free from infection Outcome: Progressing   Problem: Activity: Goal: Risk for activity intolerance will decrease Outcome: Progressing   Problem: Nutrition: Goal: Adequate nutrition will be maintained Outcome: Progressing   Problem: Pain Managment: Goal: General experience of comfort will improve Outcome: Adequate for Discharge

## 2020-08-20 LAB — SURGICAL PATHOLOGY

## 2020-08-21 ENCOUNTER — Ambulatory Visit: Payer: BC Managed Care – PPO | Admitting: Internal Medicine

## 2020-10-26 ENCOUNTER — Other Ambulatory Visit: Payer: Self-pay | Admitting: Internal Medicine

## 2020-11-13 ENCOUNTER — Telehealth: Payer: Self-pay | Admitting: Internal Medicine

## 2020-11-13 NOTE — Telephone Encounter (Signed)
Letter sent.

## 2020-11-13 NOTE — Telephone Encounter (Signed)
Arrange a CPX within the next 3 months

## 2020-11-13 NOTE — Telephone Encounter (Signed)
I sent a refill for the allopurinol for this Pt- however he hasn't seen you since 05/2020 and no future appt scheduled, he has been going through prostate CA treatment- when did you want me to schedule a f/u for him?

## 2020-12-04 LAB — PSA: PSA: 0.45

## 2020-12-15 ENCOUNTER — Other Ambulatory Visit (HOSPITAL_COMMUNITY): Payer: Self-pay | Admitting: Urology

## 2020-12-15 DIAGNOSIS — C61 Malignant neoplasm of prostate: Secondary | ICD-10-CM

## 2020-12-22 ENCOUNTER — Other Ambulatory Visit: Payer: Self-pay

## 2020-12-22 ENCOUNTER — Ambulatory Visit (HOSPITAL_COMMUNITY)
Admission: RE | Admit: 2020-12-22 | Discharge: 2020-12-22 | Disposition: A | Discharge status: Home / Self Care | Payer: BC Managed Care – PPO | Source: Ambulatory Visit | Attending: Urology | Admitting: Urology

## 2020-12-22 DIAGNOSIS — C61 Malignant neoplasm of prostate: Secondary | ICD-10-CM | POA: Insufficient documentation

## 2020-12-22 MED ORDER — PIFLIFOLASTAT F 18 (PYLARIFY) INJECTION
9.0000 | Freq: Once | INTRAVENOUS | Status: AC
  Administered 2020-12-22: 8.45 via INTRAVENOUS

## 2021-01-04 ENCOUNTER — Encounter: Payer: Self-pay | Admitting: Internal Medicine

## 2021-01-18 ENCOUNTER — Encounter: Payer: Self-pay | Admitting: Internal Medicine

## 2021-01-18 ENCOUNTER — Ambulatory Visit (INDEPENDENT_AMBULATORY_CARE_PROVIDER_SITE_OTHER): Payer: BC Managed Care – PPO | Admitting: Internal Medicine

## 2021-01-18 VITALS — BP 116/76 | HR 83 | Temp 97.8°F | Resp 16 | Ht 71.5 in | Wt 239.4 lb

## 2021-01-18 DIAGNOSIS — C61 Malignant neoplasm of prostate: Secondary | ICD-10-CM

## 2021-01-18 DIAGNOSIS — E785 Hyperlipidemia, unspecified: Secondary | ICD-10-CM

## 2021-01-18 DIAGNOSIS — Z0000 Encounter for general adult medical examination without abnormal findings: Secondary | ICD-10-CM | POA: Diagnosis not present

## 2021-01-18 DIAGNOSIS — Z1159 Encounter for screening for other viral diseases: Secondary | ICD-10-CM

## 2021-01-18 DIAGNOSIS — Z23 Encounter for immunization: Secondary | ICD-10-CM | POA: Diagnosis not present

## 2021-01-18 DIAGNOSIS — I10 Essential (primary) hypertension: Secondary | ICD-10-CM | POA: Diagnosis not present

## 2021-01-18 NOTE — Progress Notes (Signed)
Subjective:    Patient ID: Justin Elliott, male    DOB: 1965/09/28, 55 y.o.   MRN: [id number]  DOS:  01/18/2021 Type of visit - description: CPX  Since last year, was diagnosed with prostate cancer and had a radical prostatectomy. Things are going well. Denies any major problems with LUTS.  He has developed ill-defined abdominal symptoms: Bloated, grawling.  He denies nausea, vomiting, diarrhea. No blood in the stools.  No heartburn.  No fever chills.  No weight loss.   Review of Systems  Other than above, a 14 point review of systems is negative       Past Medical History:  Diagnosis Date   Elevated serum creatinine    Family history of breast cancer    Family history of prostate cancer    Gout    HTN (hypertension) 2009   Polypoid middle turbinate    Prostate cancer [facility])     Past Surgical History:  Procedure Laterality Date   FINGER SURGERY Right 1984   LYMPHADENECTOMY Bilateral 08/17/2020   Procedure: LYMPHADENECTOMY, PELVIC;  Surgeon: Raynelle Bring, MD;  Location: [facility];  Service: Urology;  Laterality: Bilateral;   NASAL ENDOSCOPY     PROSTATE BIOPSY  05/20/2020   +   ROBOT ASSISTED LAPAROSCOPIC RADICAL PROSTATECTOMY N/A 08/17/2020   Procedure: XI ROBOTIC ASSISTED LAPAROSCOPIC RADICAL PROSTATECTOMY LEVEL 2;  Surgeon: Raynelle Bring, MD;  Location: [facility];  Service: Urology;  Laterality: N/A;   Social History   Socioeconomic History   Marital status: Married    Spouse name: Jefry Lesinski   Number of children: 2   Years of education: Not on file   Highest education level: Not on file  Occupational History   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 [employer]  Tobacco Use   Smoking status: Never   Smokeless tobacco: Never  Vaping Use   Vaping Use: Never used  Substance and Sexual Activity   Alcohol use: No    Alcohol/week: 0.0 standard drinks   Drug use: No   Sexual activity: Yes  Other Topics Concern   Not on file  Social History Narrative    Household: wife      D 2003    S 1998 (graduated , college)   PHD 2017   2 children: Ysidro Evert and [employer]   Social Determinants of Health   Financial Resource Strain: Not on file  Food Insecurity: Not on file  Transportation Needs: Not on file  Physical Activity: Not on file  Stress: Not on file  Social Connections: Not on file  Intimate Partner Violence: Not on file     Allergies as of 01/18/2021       Reactions   Losartan    Sore throat, no lip-tongue swelling        Medication List        Accurate as of January 18, 2021 11:59 PM. If you have any questions, ask your nurse or doctor.          STOP taking these medications    allopurinol 100 MG tablet Commonly known as: ZYLOPRIM Stopped by: Kathlene November, MD   docusate sodium 100 MG capsule Commonly known as: COLACE Stopped by: Kathlene November, MD   sulfamethoxazole-trimethoprim 800-160 MG tablet Commonly known as: BACTRIM DS Stopped by: Kathlene November, MD   traMADol 50 MG tablet Commonly known as: Ultram Stopped by: Kathlene November, MD       TAKE these medications    amLODipine 10 MG tablet Commonly known as:  NORVASC Take 1 tablet (10 mg total) by mouth daily.   carvedilol 25 MG tablet Commonly known as: COREG TAKE 1 TABLET (25 MG TOTAL) BY MOUTH 2 (TWO) TIMES DAILY WITH A MEAL.   colchicine 0.6 MG tablet TAKE 1 TABLET (0.6 MG TOTAL) BY MOUTH 2 (TWO) TIMES DAILY AS NEEDED (GOUT).   desonide 0.05 % lotion Commonly known as: DESOWEN Apply topically as needed.           Objective:   Physical Exam BP 116/76 (BP Location: Left Arm, Patient Position: Sitting, Cuff Size: Normal)   Pulse 83   Temp 97.8 F (36.6 C) (Oral)   Resp 16   Ht 5' 11.5" (1.816 m)   Wt 239 lb 6 oz (108.6 kg)   SpO2 98%   BMI 32.92 kg/m  General: Well developed, NAD, BMI noted Neck: No  thyromegaly  HEENT:  Normocephalic . Face symmetric, atraumatic Lungs:  CTA B Normal respiratory effort, no intercostal retractions, no accessory  muscle use. Heart: RRR,  no murmur.  Abdomen:  Not distended, soft, non-tender. No rebound or rigidity.   Lower extremities: no pretibial edema bilaterally  Skin: Exposed areas without rash. Not pale. Not jaundice Neurologic:  alert & oriented X3.  Speech normal, gait appropriate for age and unassisted Strength symmetric and appropriate for age.  Psych: Cognition and judgment appear intact.  Cooperative with normal attention span and concentration.  Behavior appropriate. No anxious or depressed appearing.     Assessment    Assessment: HTN Gout: DX 08-2016 Slightly increased creatinine:  Renal US 2017 wnl, microalbumin (-). Likely d/t large muscle mass. Saw nephrology 11/2016, they rec good BP control, treat elevated cholesterol if needed, weight loss, avoid NSAIDs, keep hydrated, consider treatment w/ uric acid lowering meds Prostate cancer: Radical prostatectomy 08/17/2020  PLAN  Here for CPX HTN: Seems well controlled, continue present care Gout: States he never started allopurinol, having basically no symptoms, he plans to take colchicine as needed. Prostate cancer: See comments under CPX. RTC 1 year     This visit occurred during the SARS-CoV-2 public health emergency.  Safety protocols were in place, including screening questions prior to the visit, additional usage of staff PPE, and extensive cleaning of exam room while observing appropriate contact time as indicated for disinfecting solutions.

## 2021-01-18 NOTE — Patient Instructions (Addendum)
Shots I recommend: COVID booster Shingrix  Check the  blood pressure regularly (once a month) BP GOAL is between 110/65 and  135/85. If it is consistently higher or lower, let me know     GO TO THE LAB : Get the blood work     [place], [place] back for a physical exam in 1 year immunizations I

## 2021-01-19 ENCOUNTER — Encounter: Payer: Self-pay | Admitting: Internal Medicine

## 2021-01-19 LAB — LIPID PANEL
Cholesterol: 190 mg/dL (ref 0–200)
HDL: 26.1 mg/dL — ABNORMAL LOW (ref >39.00)
NonHDL: 164.02
Total CHOL/HDL Ratio: 7
Triglycerides: 355 mg/dL — ABNORMAL HIGH (ref 0.0–149.0)
VLDL: 71 mg/dL — ABNORMAL HIGH (ref 0.0–40.0)

## 2021-01-19 LAB — CBC WITH DIFFERENTIAL/PLATELET
Basophils Absolute: 0 10*3/uL (ref 0.0–0.1)
Basophils Relative: 1 % (ref 0.0–3.0)
Eosinophils Absolute: 0.1 10*3/uL (ref 0.0–0.7)
Eosinophils Relative: 2.2 % (ref 0.0–5.0)
HCT: 39.6 % (ref 39.0–52.0)
Hemoglobin: 13.3 g/dL (ref 13.0–17.0)
Lymphocytes Relative: 42.7 % (ref 12.0–46.0)
Lymphs Abs: 1.6 10*3/uL (ref 0.7–4.0)
MCHC: 33.6 g/dL (ref 30.0–36.0)
MCV: 83.5 fl (ref 78.0–100.0)
Monocytes Absolute: 0.3 10*3/uL (ref 0.1–1.0)
Monocytes Relative: 9 % (ref 3.0–12.0)
Neutro Abs: 1.7 10*3/uL (ref 1.4–7.7)
Neutrophils Relative %: 45.1 % (ref 43.0–77.0)
Platelets: 230 10*3/uL (ref 150.0–400.0)
RBC: 4.74 Mil/uL (ref 4.22–5.81)
RDW: 13.7 % (ref 11.5–15.5)
WBC: 3.8 10*3/uL — ABNORMAL LOW (ref 4.0–10.5)

## 2021-01-19 LAB — COMPREHENSIVE METABOLIC PANEL
ALT: 17 U/L (ref 0–53)
AST: 16 U/L (ref 0–37)
Albumin: 4.2 g/dL (ref 3.5–5.2)
Alkaline Phosphatase: 68 U/L (ref 39–117)
BUN: 13 mg/dL (ref 6–23)
CO2: 27 mEq/L (ref 19–32)
Calcium: 9.6 mg/dL (ref 8.4–10.5)
Chloride: 105 mEq/L (ref 96–112)
Creatinine, Ser: 1.52 mg/dL — ABNORMAL HIGH (ref 0.40–1.50)
GFR: 51.06 mL/min — ABNORMAL LOW (ref >60.00)
Glucose, Bld: 106 mg/dL — ABNORMAL HIGH (ref 70–99)
Potassium: 3.9 mEq/L (ref 3.5–5.1)
Sodium: 140 mEq/L (ref 135–145)
Total Bilirubin: 0.4 mg/dL (ref 0.2–1.2)
Total Protein: 7.1 g/dL (ref 6.0–8.3)

## 2021-01-19 LAB — LDL CHOLESTEROL, DIRECT: Direct LDL: 133 mg/dL

## 2021-01-19 LAB — HEPATITIS C ANTIBODY
Hepatitis C Ab: NONREACTIVE
SIGNAL TO CUT-OFF: 0.04 (ref <1.00)

## 2021-01-19 NOTE — Assessment & Plan Note (Signed)
°-   Td 2014 - shingrix vax d/w pt  -Covid vac x3 rec booster  -Flu shot today -- + FH for  prostate, breast cancer, TIA stroke. --CCS: cscope 08-2015, 10 years --dx with prostate cancer, status post prostatectomy 08/18/2018 -Diet, Exercise: Encouraged a healthy lifestyle -Labs: CMP, FLP,CBC, hep C. -ACP: Package of information provided

## 2021-01-19 NOTE — Assessment & Plan Note (Signed)
Here for CPX HTN: Seems well controlled, continue present care Gout: States he never started allopurinol, having basically no symptoms, he plans to take colchicine as needed. Prostate cancer: See comments under CPX. RTC 1 year

## 2021-01-20 NOTE — Addendum Note (Signed)
Addended byDamita Dunnings D on: 01/20/2021 02:01 PM   Modules accepted: Orders

## 2021-01-25 ENCOUNTER — Other Ambulatory Visit: Payer: Self-pay | Admitting: Internal Medicine

## 2021-01-25 MED ORDER — CARVEDILOL 25 MG PO TABS: 25.0000 mg | ORAL_TABLET | Freq: Two times a day (BID) | ORAL | 1 refills | Status: DC

## 2021-01-25 MED ORDER — AMLODIPINE BESYLATE 10 MG PO TABS: 10.0000 mg | ORAL_TABLET | Freq: Every day | ORAL | 1 refills | Status: DC

## 2021-01-25 MED ORDER — COLCHICINE 0.6 MG PO TABS
0.6000 mg | ORAL_TABLET | Freq: Two times a day (BID) | ORAL | 0 refills | Status: DC | PRN
Start: 2021-01-25 — End: 2022-01-09

## 2021-02-09 ENCOUNTER — Telehealth: Payer: Self-pay

## 2021-02-09 NOTE — Progress Notes (Signed)
I called Justin Elliott to introduce myself as the Prostate Nurse Navigator and the Coordinator of the Prostate [place].   1. I confirmed with the patient he is aware of his referral to the clinic on 02/19/2021, arriving @ 8:00am.    2. I discussed the format of the clinic and the physicians he will be seeing that day.   3. I discussed where the clinic is located and how to contact me.   4. I confirmed his address and informed him I would be mailing a packet of information and forms to be completed. I asked him to bring them with him the day of his appointment.    He voiced understanding of the above. I asked him to call me if he has any questions or concerns regarding his appointments or the forms he needs to complete.

## 2021-02-09 NOTE — Telephone Encounter (Signed)
Left message for call back regarding PMDC on 02/19/2021.

## 2021-02-18 ENCOUNTER — Telehealth: Payer: Self-pay

## 2021-02-18 NOTE — Telephone Encounter (Signed)
Left message with patient reminding of upcoming [place] appointment.

## 2021-02-18 NOTE — Progress Notes (Addendum)
° °                              Care Plan Summary  Name: Justin Elliott DOB: [birth year]    Your Medical Team:   Urologist -  Dr. Raynelle Bring, [facility]  Radiation Oncologist - Dr. Tyler Pita, [facility]   Medical Oncologist - Dr. Zola Button, [place]  Recommendations: 1) Radiation   2) ADT (Androgen Deprivation Therapy)     * These recommendations are based on information available as of todays consult.      Recommendations may change depending on the results of further tests or exams.    Next Steps: 1) Referral will be placed for radiation at [facility]  2) Urology will contact you to set up ADT.   When appointments need to be scheduled, you will be contacted by [facility] and/or [facility].  Questions?  Please do not hesitate to call Katheren Puller, BSN, RN at [phone] with any questions or concerns.  Kathlee Nations is your Oncology Nurse Navigator and is available to assist you while youre receiving your medical care at [facility].

## 2021-02-19 ENCOUNTER — Ambulatory Visit
Admission: RE | Admit: 2021-02-19 | Discharge: 2021-02-19 | Disposition: A | Discharge status: Home / Self Care | Payer: BC Managed Care – PPO | Source: Ambulatory Visit | Attending: Radiation Oncology | Admitting: Radiation Oncology

## 2021-02-19 ENCOUNTER — Encounter: Payer: Self-pay | Admitting: General Practice

## 2021-02-19 ENCOUNTER — Inpatient Hospital Stay: Payer: BC Managed Care – PPO | Attending: Oncology | Admitting: Oncology

## 2021-02-19 ENCOUNTER — Other Ambulatory Visit: Payer: Self-pay

## 2021-02-19 VITALS — BP 134/82 | HR 82 | Temp 97.8°F | Resp 20 | Ht 72.0 in | Wt 237.8 lb

## 2021-02-19 DIAGNOSIS — C61 Malignant neoplasm of prostate: Secondary | ICD-10-CM

## 2021-02-19 DIAGNOSIS — Z79899 Other long term (current) drug therapy: Secondary | ICD-10-CM | POA: Diagnosis not present

## 2021-02-19 NOTE — Progress Notes (Signed)
Hematology and Oncology Follow Up Visit  Justin Elliott [id number] [birth year] 56 y.o. 02/19/2021 9:02 AM Colon Branch, MDPaz, Alda Berthold, MD   Principle Diagnosis: 56 year old with prostate cancer diagnosed in April of 2022.  He was found to have Gleason score 4+5 = 9 and a PSA of 8.3.  The final pathologic staging was T3a N1 after radical prostatectomy.   Prior Therapy: He is status post robotic assisted radical prostatectomy completed on August 17, 2020.  The final pathology showed Gleason score 4+ 5 = with negative margins although 3 out of 8 lymph nodes involved.   Current therapy: Under evaluation for salvage therapy  Interim History: [name] returns today for a follow-up visit.since the last visit, he reports no major changes in his health.  He has tolerated surgery without any major complaints.  He has recovered his continence for the most part.  Follow-up PSA by Dr. Alinda Money showed persistent elevation without undetectable level.  PSA on February 09, 2021 was 0.59 previously was 0.45 on October of 2022.  PSMA PET scan did not show any evidence of metastatic disease completed on December 22, 2020.    opathy or petechiae.  Does not report any anxiety or depression.  Remaining review of systems is negative.    Medications: I have reviewed the patient's current medications.  Current Outpatient Medications  Medication Sig Dispense Refill   amLODipine (NORVASC) 10 MG tablet Take 1 tablet (10 mg total) by mouth daily. 90 tablet 1   carvedilol (COREG) 25 MG tablet Take 1 tablet (25 mg total) by mouth 2 (two) times daily with a meal. 180 tablet 1   colchicine 0.6 MG tablet Take 1 tablet (0.6 mg total) by mouth 2 (two) times daily as needed (gout). 180 tablet 0   desonide (DESOWEN) 0.05 % lotion Apply topically as needed.     No current facility-administered medications for this visit.     Allergies:  Allergies  Allergen Reactions   Losartan     Sore throat, no lip-tongue swelling     Past Medical History, Surgical history, Social history, and Family History were reviewed and updated.  Review of Systems:  Remaining ROS negative.    Lab Results: Lab Results  Component Value Date   WBC 3.8 (L) 01/18/2021   HGB 13.3 01/18/2021   HCT 39.6 01/18/2021   MCV 83.5 01/18/2021   PLT 230.0 01/18/2021     Chemistry      Component Value Date/Time   NA 140 01/18/2021 1536   NA 140 09/20/2016 0000   K 3.9 01/18/2021 1536   CL 105 01/18/2021 1536   CO2 27 01/18/2021 1536   BUN 13 01/18/2021 1536   BUN 13 09/20/2016 0000   CREATININE 1.52 (H) 01/18/2021 1536   CREATININE 1.48 (H) 03/07/2014 1556   GLU 105 09/20/2016 0000      Component Value Date/Time   CALCIUM 9.6 01/18/2021 1536   ALKPHOS 68 01/18/2021 1536   AST 16 01/18/2021 1536   ALT 17 01/18/2021 1536   BILITOT 0.4 01/18/2021 1536         Impression and Plan:   56 year old with:  1.  Prostate cancer diagnosed in 2022 after initially presenting with Gleason score 4+5 = 9 and a PSA of 8.3.  He was found to have T3AN1 disease after her radical prostatectomy.  His PSA continues to have persistent elevation without any evidence of documented disease on PET scan.  This case was discussed again in the prostate  cancer multidisciplinary clinic including reviewing his pathology results, imaging studies with pathology and radiology respectively.  Treatment choices were reviewed at this time with the patient.  Salvage radiation therapy is recommended at this time given his positive lymph nodes and persistent PSA.  The role of androgen deprivation therapy was also discussed.  Duration of therapy up to 24 months were reiterated.  He is complications associated with hormone therapy including hot flashes, weight gain and sexual dysfunction were reviewed.  Additional systemic therapy including chemotherapy as well as androgen receptor pathway inhibitors were reviewed and will be deferred unless he has documented  disease later in his course.  All his questions were answered today to his satisfaction I will consider these options.  2.  Follow-up: I am happy to see him in the future as needed.   30  minutes were dedicated to this visit.  The time was dedicated to reviewing his pathology results, discussing treatment choices, complications related to therapy and future plan of care review.   Zola Button, MD 1/13/20239:02 AM

## 2021-02-19 NOTE — Progress Notes (Incomplete)
° ° °  Posterior bilateral involvement with ECE from SV, base down to apex area   Slice through base level showing bilateral posterior involvement including neurovascular bundle involvement.   Mostly pattern 5 with some pattern 4 cribiform areas   PNI, ECE   SV diffusely involved   Positive Node

## 2021-02-19 NOTE — Consult Note (Signed)
Justin Elliott  MRN: [id number]  DOB: 04-26-1965, 56 year old Male  SSN:    PRIMARY CARE:  Kathlene November, MD  REFERRING:  Kathlene November, MD  PROVIDER:  Raynelle Bring, M.D.  LOCATION:  [facility] [phone]     --------------------------------------------------------------------------------   CC/HPI: CC: Prostate Cancer   Physician requesting consult: Dr. Irine Seal  PCP: Dr. Kathlene November  Location of consult: [facility]state Cancer [facility]idisciplinary [facility]   Justin Elliott is a 56 year old gentleman with a past medical history significant for hypertension, gout, hyperlipidemia, and arthritis. He was found to have an elevated PSA of 8.33 prompting an evaluation and TRUS biopsy of the prostate on 05/20/20 that confirmed Gleason 4+5=9 adenocarcinoma of the prostate with 7 out of 12 biopsy cores positive for malignancy. Initial staging was done with conventional imaging with CT and bone scan and were negative for metastatic disease. He was evaluated in the [facility] in May and elected primary surgical therapy. He underwent a NNS RAL radical prostatectomy and BPLND on 08/17/20 and his surgical pathology demonstrated pT3b N1 Mx, Gleason 4+5=9 adenocarcinoma with negative surgical margins. He had 3 out of 14 lymph nodes positive for metastatic disease. He regained continence. However, his first postoperative PSA was persistently elevated at 0.45 on 12/04/20. He underwent a PSMA PET scan on 12/22/20 that was negative for metastatic disease or local disease in the pelvis. His PSA last week was 0.57.   At this point, he has regained full continence. As expected following a non-nerve sparing prostatectomy, he has not seen any spontaneous erectile activity.   Family history: He has a father with a history of prostate cancer and breast cancer and a  sister with a history of breast cancer. His father died at age 72 and was on what sounds like androgen deprivation when he died but did not die of prostate cancer. His sister did die of metastatic breast cancer in her 38s.    Baseline urinary function: IPSS is 8.  Baseline erectile function: SHIM score is 20.     ALLERGIES: No Allergies    MEDICATIONS: Amlodipine Besylate 10 mg tablet  Carvedilol 25 mg tablet  Colchicine 0.6 mg tablet  Levofloxacin 750 mg tablet 1 po 1 hour prior to the procedure     GU PSH: Locm 300-399Mg/Ml Iodine,1Ml - 06/05/2020 Prostate Needle Biopsy - 05/20/2020 Robotic Radical Prostatectomy - 08/13/2020       PSH Notes: finger surgery-1984   NON-GU PSH: Surgical Pathology, Gross And Microscopic Examination For Prostate Needle - 05/20/2020     GU PMH: Prostate Cancer - 12/09/2020, - 08/25/2020, - 07/31/2020, - 07/27/2020, - 07/03/2020, - 06/19/2020 Stress Incontinence - 12/09/2020, - 10/05/2020, - 09/14/2020, - 08/25/2020 Acute Cystitis/UTI - 09/10/2020 Elevated PSA - 06/05/2020, - 05/20/2020, - 03/30/2020 BPH w/o LUTS - 03/30/2020      PMH Notes:   1) Prostate cancer: He is s/p a NNS RAL radical prostatectomy and BPLND on 08/17/20.   Diagnosis: pT3b N1 Mx, Gleason 4+5=9 adenocarcinoma with negative surgical margins (3/14 lymph nodes positive)  Pretreatment PSA: 8.33  Pretreatment SHIM score: 20   NON-GU PMH: Bacteriuria - 12/09/2020 Arthritis Gout Hypercholesterolemia Hypertension    FAMILY HISTORY: Breast Cancer - Sister heart failure - Father Prostate Cancer - Father    Notes: 1 son; 1 daughter    SOCIAL HISTORY: Marital Status: Married Preferred Language:  [place]; Ethnicity: Not Hispanic Or Latino; Race: Black or African American Current Smoking Status: Patient has never smoked.   Tobacco Use Assessment Completed: Used Tobacco in last 30 days? Does not use smokeless tobacco. Has never drank.  Drinks 2 caffeinated drinks per day. Patient's occupation  Tour manager.    REVIEW OF SYSTEMS:    GU Review Male:   Patient denies frequent urination, hard to postpone urination, burning/ pain with urination, get up at night to urinate, leakage of urine, stream starts and stops, trouble starting your streams, and have to strain to urinate .  Gastrointestinal (Upper):   Patient denies nausea and vomiting.  Gastrointestinal (Lower):   Patient denies diarrhea and constipation.  Constitutional:   Patient denies fever, night sweats, weight loss, and fatigue.  Skin:   Patient denies skin rash/ lesion and itching.  Eyes:   Patient denies blurred vision and double vision.  Ears/ Nose/ Throat:   Patient denies sore throat and sinus problems.  Hematologic/Lymphatic:   Patient denies swollen glands and easy bruising.  Cardiovascular:   Patient denies chest pains and leg swelling.  Respiratory:   Patient denies cough and shortness of breath.  Endocrine:   Patient denies excessive thirst.  Musculoskeletal:   Patient denies back pain and joint pain.  Neurological:   Patient denies headaches and dizziness.  Psychologic:   Patient denies depression and anxiety.   VITAL SIGNS: None   MULTI-SYSTEM PHYSICAL EXAMINATION:    Constitutional: Well-nourished. No physical deformities. Normally developed. Good grooming.     Complexity of Data:  Lab Test Review:   PSA  Records Review:   Pathology Reports, Previous Patient Records  X-Ray Review: PET Scan: Reviewed Films.     12/04/20 02/21/20 09/20/19 07/10/17 06/16/15 03/07/14  PSA  Total PSA 0.45 ng/mL 8.33 ng/ml 5.84 ng/ml 1.97 ng/ml 1.74 ng/ml 2.69 ng/ml   Notes:                     CLINICAL DATA: Prostate cancer status post radical prostatectomy  05/20/2020. recent PSA equal 0.45   EXAM:  NUCLEAR MEDICINE PET SKULL BASE TO THIGH   TECHNIQUE:  mCi F18 Piflufolastat (Pylarify) was injected intravenously.  Full-ring PET imaging was performed from the skull base to thigh  after the radiotracer. CT data  was obtained and used for attenuation  correction and anatomic localization.   COMPARISON: None.   FINDINGS:  NECK   No radiotracer activity in neck lymph nodes.   Incidental CT finding: None   CHEST   No radiotracer accumulation within mediastinal or hilar lymph nodes.  No suspicious pulmonary nodules on the CT scan.   Incidental CT finding: None   ABDOMEN/PELVIS   Prostate: No focal activity in the prostate bed.   Lymph nodes: No abnormal radiotracer accumulation within pelvic or  abdominal nodes.   Liver: No evidence of liver metastasis   Incidental CT finding: Several nonobstructing RIGHT renal calculi.  Atherosclerotic calcification of the aorta.. Midline ventral scar  without complication.   SKELETON   No focal activity to suggest skeletal metastasis. No sclerotic  lesions.   IMPRESSION:  1. No evidence of local recurrence in the prostatectomy bed.  2. No evidence of metastatic lymphadenopathy.  3. No visceral metastasis or skeletal metastasis    Electronically Signed  By: Suzy Bouchard M.D.  On: 12/23/2020 15:06   PROCEDURES: None   ASSESSMENT:      ICD-10 Details  1 GU:   Prostate Cancer - C61  PLAN:           Document Letter(s):  Created for Patient: Clinical Summary         Notes:   1. 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 Alen Blew and Dr. Tammi Klippel today in the multidisciplinary clinic. After reviewing options for further management and reviewing his PSMA PET scan that was negative for localization of disease, we also reviewed his recent PSA which continues to increase both confirming biochemical recurrence but also suggesting a relatively slow rise in his PSA. We reviewed options for management and he is amenable to proceeding with combination systemic androgen deprivation and pelvic radiation to the pelvic lymph nodes and prostatic fossa. He discussed this in depth with Dr. Tammi Klippel  today. Due to his work schedule and location of work in [place], [place], Dr. Tammi Klippel has suggested that he be treated at [facility] and has contacted them today to help arrange a consultation in order to proceed with salvage radiation therapy. We discussed the hope that this may be potentially curative but with a realistic outlook and also reviewed the possibility of needing additional therapy in the future. We also reviewed the potential risks and side effects of salvage radiation therapy today. He is going to proceed with 6 months of androgen deprivation therapy that will be administered sometime in the next 1 to 2 weeks. I will plan to have him follow-up either late spring or early summer with his next PSA a couple of months after completion of radiation therapy. At his request, he will be offered to have his labs performed at an outside lab.   2. Incontinence: This has resolved. We did review the potential urinary risks of proceeding with salvage radiation therapy although he has regained full continence at this time and that risk should be low.   3. Erectile dysfunction: We discussed the impact and side effects of androgen deprivation therapy. Considering his non-nerve sparing prostatectomy, he is unlikely to respond to PDE 5 inhibitors and we therefore reviewed alternative treatments such as vacuum erection devices, intracavernosal injection therapy, intraurethral suppositories, and surgical prosthesis. I did not recommend surgical therapy in the near future considering the potential risk that he may require further systemic therapy including androgen deprivation beyond the short-term.   CC: Dr. Kathlene November  Dr. Tyler Pita  Dr. Zola Button  Dr, Lamar Blinks - Radiation Oncology [facility]  Dr. Lindell Spar - Radiation Oncology [facility]        Next Appointment:      Next Appointment: 06/04/2021 03:30 PM    Appointment Type: Laboratory  Appointment    Location: [facility] [phone]    Provider: Lab LAB    Reason for Visit: 6 mo psa      E & M CODES: We spent 36 minutes dedicated to evaluation and management time, including face to face interaction, discussions on coordination of care, documentation, result review, and discussion with others as applicable.

## 2021-02-19 NOTE — Progress Notes (Signed)
[place] Psychosocial Distress Screening Spiritual Care  Met with Justin Elliott and his wife Justin Elliott in [facility] to introduce Justin Elliott team/resources, reviewing distress screen per protocol.  The patient scored a 5 on the Psychosocial Distress Thermometer which indicates moderate distress. Also assessed for distress and other psychosocial needs.   ONCBCN DISTRESS SCREENING 02/19/2021  Screening Type Initial Screening  Distress experienced in past week (1-10) 5  Referral to support programs Yes   Justin Elliott reports moderate distress about diagnosis with no specific concerns. Normalized feelings and introduced [facility] programming and support team, including Spiritual Care. Encouraged Prostate Cancer Support Group as valuable resource for sharing information and encouragement.  Follow up needed: No. Per Justin Elliott, no other needs at this time, but he has team's contact info and knows to reach out if needs arise or circumstances change.   [place], [place], [facility] Pager [phone] Voicemail [phone]

## 2021-02-19 NOTE — Progress Notes (Signed)
Radiation Oncology         [phone] ________________________________  Multidisciplinary Prostate Cancer Clinic  Radiation Oncology Re-Consultation  Name: Justin Elliott MRN: [id number]  Date: 02/19/2021  DOB: [birth year]  CC:Paz, Alda Berthold, MD  Raynelle Bring, MD   REFERRING PHYSICIAN: Raynelle Bring, MD  DIAGNOSIS: 56 y.o. gentleman with adverse pathology and detectable postoperative PSA of 0.45 s/p RALP in 08/2020 for stage pT3b pN1 cN0 Gleason 4+5 prostate cancer - Stage IVA    ICD-10-CM   1. Prostate cancer ([place])  C61        HISTORY OF PRESENT ILLNESS::Justin Elliott is a 56 y.o. gentleman.  He was noted to have an elevated PSA of 8.33 on 02/21/20, by his primary care physician, Dr. Larose Kells.  Accordingly, he was referred for evaluation in urology by Dr. Jeffie Pollock on 03/30/20,  digital rectal examination was performed at that time revealing no nodules.  The patient proceeded to transrectal ultrasound with 12 biopsies of the prostate on 05/20/20.  The prostate volume measured 37 cc.  Out of 12 core biopsies, 7 were positive.  The maximum Gleason score was 4+5, and this was seen in the left base lateral, left base, left mid, right base (with perineural invasion), and right base lateral (with PNI). Additionally, Gleason 3+4 was seen in the right apex lateral, and Gleason 3+3 in the left apex.    He underwent staging bone scan on 06/02/20, which was negative for osseous metastatic disease. He also underwent CT A/P on 06/05/20 showing no evidence of lymphadenopathy or visceral metastatic disease. Of note, there were several nonspecific right lower lobe pulmonary nodules that will need to be monitored on follow up imaging.  We saw the patient in the multidisciplinary prostate cancer clinic on 06/19/20. After our discussions, he opted to proceed with prostatectomy. He had preoperative prostate MRI for surgical planning on 08/07/20 showing a PI-RADS 5 lesion involving bilateral central and posterior  peripheral zones with broad capsular contact raising the likelihood of transcapsular spread and delayed enhancement in the seminal vesicles, suspicious for early seminal vesicle involvement.  He underwent RALP on 08/17/20 under the care of Dr. Alinda Money. Final surgical pathology confirmed pT3bN1, prostatic acinar adenocarcinoma, Gleason 4+5, with prostatic intraepithelial neoplasia, extraprostatic extension and involvement of the seminal vesicles. Fortunately, the resection margins were negative but a total of three lymph nodes were positive (3/14), one from the right lobe prostate and two from the left lobe prostate.  Below are some illustrative images of the pathology findings including whole mount slides from our multidisciplinary conference:  Posterior bilateral involvement with ECE from SV, base down to apex area   Slice through base level showing bilateral posterior involvement including neurovascular bundle involvement.   Mostly pattern 5 with some pattern 4 cribiform areas   PNI, ECE   SV diffusely involved   Positive Node   His postoperative PSA on 12/04/20 was low detectable at 0.45. He proceeded to PSMA PET scan on 12/22/20 showing no evidence of local recurrence or metastatic disease. A repeat PSA on 02/09/21 had slightly increased to 0.59.  The patient reviewed the pathology, PSA and imaging results with his urologist and he has kindly been referred today to the multidisciplinary prostate cancer clinic for presentation of pathology and radiology studies in our conference for discussion of potential radiation treatment options and clinical evaluation.    PREVIOUS RADIATION THERAPY: No  PAST MEDICAL HISTORY:  has a past medical history of Elevated serum creatinine, Family history of breast  cancer, Family history of prostate cancer, Gout, HTN (hypertension) (2009), Polypoid middle turbinate, and Prostate cancer ([place]).    PAST SURGICAL HISTORY: Past Surgical History:  Procedure  Laterality Date   FINGER SURGERY Right 1984   LYMPHADENECTOMY Bilateral 08/17/2020   Procedure: LYMPHADENECTOMY, PELVIC;  Surgeon: Raynelle Bring, MD;  Location: [facility];  Service: Urology;  Laterality: Bilateral;   NASAL ENDOSCOPY     PROSTATE BIOPSY  05/20/2020   +   ROBOT ASSISTED LAPAROSCOPIC RADICAL PROSTATECTOMY N/A 08/17/2020   Procedure: XI ROBOTIC ASSISTED LAPAROSCOPIC RADICAL PROSTATECTOMY LEVEL 2;  Surgeon: Raynelle Bring, MD;  Location: [facility];  Service: Urology;  Laterality: N/A;    FAMILY HISTORY: family history includes Breast cancer in his sister; Diabetes type I in his son; Prostate cancer (age of onset: 69) in his father; Stroke (age of onset: 51) in his mother.  SOCIAL HISTORY:  reports that he has never smoked. He has never used smokeless tobacco. He reports that he does not drink alcohol and does not use drugs.  ALLERGIES: Losartan  MEDICATIONS:  Current Outpatient Medications  Medication Sig Dispense Refill   amLODipine (NORVASC) 10 MG tablet Take 1 tablet (10 mg total) by mouth daily. 90 tablet 1   carvedilol (COREG) 25 MG tablet Take 1 tablet (25 mg total) by mouth 2 (two) times daily with a meal. 180 tablet 1   colchicine 0.6 MG tablet Take 1 tablet (0.6 mg total) by mouth 2 (two) times daily as needed (gout). 180 tablet 0   desonide (DESOWEN) 0.05 % lotion Apply topically as needed.     No current facility-administered medications for this encounter.    REVIEW OF SYSTEMS:  On review of systems, the patient reports that he is doing well overall. He denies any chest pain, shortness of breath, cough, fevers, chills, night sweats, unintended weight changes. He denies any bowel disturbances, and denies abdominal pain, nausea or vomiting. He denies any new musculoskeletal or joint aches or pains. His IPSS score is 3, indicating mild urinary symptoms. He reports he has regained full bladder control. His SHIM is 5, indicating postoperative erectile dysfunction. A complete  review of systems is obtained and is otherwise negative.   PHYSICAL EXAM:  Wt Readings from Last 3 Encounters:  02/19/21 237 lb 12.8 oz (107.9 kg)  01/18/21 239 lb 6 oz (108.6 kg)  08/17/20 231 lb 15.5 oz (105.2 kg)   Temp Readings from Last 3 Encounters:  02/19/21 97.8 F (36.6 C)  01/18/21 97.8 F (36.6 C) (Oral)  08/18/20 98.8 F (37.1 C) (Oral)   BP Readings from Last 3 Encounters:  02/19/21 134/82  01/18/21 116/76  08/18/20 138/89   Pulse Readings from Last 3 Encounters:  02/19/21 82  01/18/21 83  08/18/20 74    /10  In general this is a well appearing African American male in no acute distress. He's alert and oriented x4 and appropriate throughout the examination. Cardiopulmonary assessment is negative for acute distress and he exhibits normal effort.    KPS = 100  100 - Normal; no complaints; no evidence of disease. 90   - Able to carry on normal activity; minor signs or symptoms of disease. 80   - Normal activity with effort; some signs or symptoms of disease. 36   - Cares for self; unable to carry on normal activity or to do active work. 60   - Requires occasional assistance, but is able to care for most of his personal needs. 50   -  Requires considerable assistance and frequent medical care. 33   - Disabled; requires special care and assistance. 33   - Severely disabled; hospital admission is indicated although death not imminent. 9   - Very sick; hospital admission necessary; active supportive treatment necessary. 10   - Moribund; fatal processes progressing rapidly. 0     - Dead  Karnofsky DA, Abelmann V[place], Craver LS and Burchenal [facility] [phone]) The use of the nitrogen mustards in the palliative treatment of carcinoma: with particular reference to bronchogenic carcinoma Cancer 1 [phone]   LABORATORY DATA:  Lab Results  Component Value Date   WBC 3.8 (L) 01/18/2021   HGB 13.3 01/18/2021   HCT 39.6 01/18/2021   MCV 83.5 01/18/2021   PLT 230.0 01/18/2021    Lab Results  Component Value Date   NA 140 01/18/2021   K 3.9 01/18/2021   CL 105 01/18/2021   CO2 27 01/18/2021   Lab Results  Component Value Date   ALT 17 01/18/2021   AST 16 01/18/2021   ALKPHOS 68 01/18/2021   BILITOT 0.4 01/18/2021     RADIOGRAPHY: No results found.     IMPRESSION/PLAN: 56 y.o. gentleman with adverse pathology and detectable postoperative PSA of 0.45 s/p RALP in 08/2020 for stage p(T3a, N1) Gleason 4+5 prostate cancer.  Today, we reviewed the findings and workup thus far.  We discussed the natural history of high risk prostate cancer.  We reviewed the the implications of positive margins, extracapsular extension, and seminal vesicle involvement on the risk of prostate cancer recurrence. In his case, extraprostatic extension and seminal vesicle invasion were present as well as lymph node involvement. We reviewed some of the evidence suggesting an advantage for patients who undergo adjuvant radiotherapy in this setting in terms of disease control and overall survival. We also discussed some of the dilemmas related to the available evidence.  We discussed the SWOG trial which did show an improvement in disease-free survival as well as overall survival using adjuvant radiotherapy. However, we discussed the fact that the study did not carefully control the usage of adjuvant radiotherapy in the observation arm. There is increasing evidence that careful surveillance with ultrasensitive PSA may provide an opportunity for early salvage in patients who undergo observation, which can lead to excellent results in terms of disease control and survival. He is showing detectable PSA in the postoperative setting so our recommendation is to proceed with a 7.5 week course of daily salvage radiotherapy. We also detailed the role of ADT in the treatment of high risk prostate cancer and outlined the associated side effects that could be expected with this therapy. We discussed radiation  treatment directed to the prostatic fossa with regard to the logistics and delivery of external beam radiation treatment. He was encouraged to ask questions that were answered to his stated satisfaction.  At the conclusion of our conversation, the patient is interested in moving forward with the recommended 7.5 week course of salvage external beam therapy in combination with ST-ADT but prefers to have his treatments closer to work in [place], [place]. He has not received his first Lupron injection so we will share our discussion with Dr. Alinda Money and make arrangements for a follow up visit, first available to start ADT now.  We will make a referral for a consult visit with Dr. Gilford Rile or Dr. Lamar Blinks in radiation oncology at [facility] to discuss having the daily radiation treatments closer to his work which will be much more convenient for  him.  We enjoyed meeting him and his wife today and look forward to following along with his progress.  We personally spent 60 minutes in this encounter including chart review, reviewing radiological studies, meeting face-to-face with the patient, entering orders, coordinating care and completing documentation.   Nicholos Johns, PA-C    Tyler Pita, MD  [facility] Direct Dial: [phone]   Fax: [phone] [email or web address]   Skype   LinkedIn   This document serves as a record of services personally performed by Tyler Pita, MD and Freeman Caldron, PA-C. It was created on their behalf by Wilburn Mylar, a trained medical scribe. The creation of this record is based on the scribe's personal observations and the provider's statements to them. This document has been checked and approved by the attending provider.

## 2021-02-22 NOTE — Progress Notes (Signed)
Referral and clinical information successfully faxed to [facility] [phone]. Request in for images of MRI prostate, and PET scan to be mailed on disc or power shared placed.   RN to follow up to ensure appointment is made for referral.

## 2021-03-01 NOTE — Progress Notes (Signed)
Left message for call back.  Re: [facility]DC Follow up.

## 2021-05-30 ENCOUNTER — Encounter: Payer: Self-pay | Admitting: Internal Medicine

## 2021-05-30 ENCOUNTER — Other Ambulatory Visit: Payer: Self-pay | Admitting: Internal Medicine

## 2021-05-31 MED ORDER — DESONIDE 0.05 % EX LOTN: TOPICAL_LOTION | CUTANEOUS | 0 refills | Status: AC | PRN

## 2021-05-31 MED ORDER — AMLODIPINE BESYLATE 10 MG PO TABS: 10.0000 mg | ORAL_TABLET | Freq: Every day | ORAL | 2 refills | Status: DC

## 2021-05-31 MED ORDER — CARVEDILOL 25 MG PO TABS: 25.0000 mg | ORAL_TABLET | Freq: Two times a day (BID) | ORAL | 2 refills | Status: DC

## 2021-06-04 LAB — PSA: PSA: <0.015

## 2021-06-10 NOTE — Progress Notes (Signed)
Patient presented to [facility] on 02/19/2021 with adverse pathology and detectable postoperative PSA of 0.45 s/p RALP in 08/2020 for stage p(T3a, N1) Gleason 4+5 prostate cancer. ? ?Patient proceed with salvage radiation treatment at [facility] and has completed.  ? ?Patient recently had his post treatment PSA at [facility], and is scheduled for MD follow up on 5/5 with Dr. Alinda Money.  ?

## 2021-06-14 ENCOUNTER — Encounter: Payer: Self-pay | Admitting: Internal Medicine

## 2021-08-09 ENCOUNTER — Encounter: Payer: Self-pay | Admitting: Internal Medicine

## 2021-09-17 ENCOUNTER — Ambulatory Visit: Payer: BC Managed Care – PPO | Admitting: Internal Medicine

## 2021-09-17 VITALS — BP 131/79 | HR 87 | Temp 98.3°F | Resp 16 | Wt 241.0 lb

## 2021-09-17 DIAGNOSIS — E785 Hyperlipidemia, unspecified: Secondary | ICD-10-CM | POA: Diagnosis not present

## 2021-09-17 DIAGNOSIS — H6501 Acute serous otitis media, right ear: Secondary | ICD-10-CM | POA: Diagnosis not present

## 2021-09-17 MED ORDER — PREDNISONE 10 MG PO TABS
ORAL_TABLET | ORAL | 0 refills | Status: DC
Start: 2021-09-17 — End: 2022-01-21

## 2021-09-17 NOTE — Patient Instructions (Signed)
For the ear problem: Prednisone as prescribed for few days Use Flonase nasal spray: 2 sprays on each side of the nose every day  If you are not gradually better let us know   Make an appointment to recheck your cholesterol in the next few weeks Watch your diet closely

## 2021-09-17 NOTE — Progress Notes (Unsigned)
   Subjective:    Patient ID: Justin Elliott, male    DOB: 1965/05/24, 56 y.o.   MRN: [id number]  DOS:  09/17/2021 Type of visit - description: Acute Several weeks history of right ear fullness. Denies actual pain, discharge or bleeding. No difficulty hearing. Also denies runny nose or sore throat, no URI type of symptoms. Has not taken any medication.     Review of Systems See above   Past Medical History:  Diagnosis Date   Elevated serum creatinine    Family history of breast cancer    Family history of prostate cancer    Gout    HTN (hypertension) 2009   Polypoid middle turbinate    Prostate cancer [facility])     Past Surgical History:  Procedure Laterality Date   FINGER SURGERY Right 1984   LYMPHADENECTOMY Bilateral 08/17/2020   Procedure: LYMPHADENECTOMY, PELVIC;  Surgeon: Raynelle Bring, MD;  Location: [facility];  Service: Urology;  Laterality: Bilateral;   NASAL ENDOSCOPY     PROSTATE BIOPSY  05/20/2020   +   ROBOT ASSISTED LAPAROSCOPIC RADICAL PROSTATECTOMY N/A 08/17/2020   Procedure: XI ROBOTIC ASSISTED LAPAROSCOPIC RADICAL PROSTATECTOMY LEVEL 2;  Surgeon: Raynelle Bring, MD;  Location: [facility];  Service: Urology;  Laterality: N/A;    Current Outpatient Medications  Medication Instructions   amLODipine (NORVASC) 10 mg, Oral, Daily   carvedilol (COREG) 25 mg, Oral, 2 times daily with meals   colchicine 0.6 mg, Oral, 2 times daily PRN   desonide (DESOWEN) 0.05 % lotion Topical, As needed       Objective:   Physical Exam BP 131/79 (BP Location: Right Arm, Patient Position: Sitting, Cuff Size: Large)   Pulse 87   Temp 98.3 F (36.8 C) (Oral)   Resp 16   Wt 241 lb (109.3 kg)   SpO2 99%   BMI 32.69 kg/m   General:   Well developed, NAD, BMI noted. HEENT:  Normocephalic . Face symmetric, atraumatic. TMs: Both TMs are slightly bulged, worse on the right.  No redness, canals normal, no wax, no discharge. Lungs:  CTA B Normal respiratory effort, no intercostal  retractions, no accessory muscle use. Heart: RRR,  no murmur.  Lower extremities: no pretibial edema bilaterally  Skin: Not pale. Not jaundice Neurologic:  alert & oriented X3.  Speech normal, gait appropriate for age and unassisted Psych--  Cognition and judgment appear intact.  Cooperative with normal attention span and concentration.  Behavior appropriate. No anxious or depressed appearing.      Assessment   Assessment: HTN Gout: DX 08-2016 Slightly increased creatinine:  Renal US 2017 wnl, microalbumin (-). Likely d/t large muscle mass. Saw nephrology 11/2016, they rec good BP control, treat elevated cholesterol if needed, weight loss, avoid NSAIDs, keep hydrated, consider treatment w/ uric acid lowering meds Prostate cancer: Radical prostatectomy 08/17/2020  PLAN R ear fullness: Suspect serous otitis, patient is quite bothered by the symptoms. Plan: Round of prednisone, Flonase, if no better he will let me know, again the patient is bothered by the symptoms, we could send him to ENT. Modestly elevated cholesterol: See last results, recommend healthier diet and recheck FLP.  He will call and schedule.

## 2021-09-18 NOTE — Assessment & Plan Note (Signed)
R ear fullness: Suspect serous otitis, patient is quite bothered by the symptoms. Plan: Round of prednisone, Flonase, if no better he will let me know, b/c sxs are so bothersome, I could send him to ENT if needed. Modestly elevated cholesterol: See last results, recommend healthier diet and recheck FLP.  He will call and schedule.

## 2021-09-24 ENCOUNTER — Other Ambulatory Visit: Payer: BC Managed Care – PPO

## 2021-12-17 LAB — PSA: PSA: 0.019

## 2021-12-23 ENCOUNTER — Encounter: Payer: Self-pay | Admitting: Internal Medicine

## 2022-01-09 ENCOUNTER — Other Ambulatory Visit: Payer: Self-pay | Admitting: Internal Medicine

## 2022-01-09 ENCOUNTER — Encounter: Payer: Self-pay | Admitting: Internal Medicine

## 2022-01-21 ENCOUNTER — Ambulatory Visit (INDEPENDENT_AMBULATORY_CARE_PROVIDER_SITE_OTHER): Payer: BC Managed Care – PPO | Admitting: Internal Medicine

## 2022-01-21 ENCOUNTER — Encounter: Payer: Self-pay | Admitting: Internal Medicine

## 2022-01-21 VITALS — BP 126/74 | HR 76 | Temp 98.4°F | Resp 18 | Ht 71.5 in | Wt 249.2 lb

## 2022-01-21 DIAGNOSIS — Z0000 Encounter for general adult medical examination without abnormal findings: Secondary | ICD-10-CM

## 2022-01-21 DIAGNOSIS — D649 Anemia, unspecified: Secondary | ICD-10-CM

## 2022-01-21 DIAGNOSIS — Z23 Encounter for immunization: Secondary | ICD-10-CM | POA: Diagnosis not present

## 2022-01-21 DIAGNOSIS — I10 Essential (primary) hypertension: Secondary | ICD-10-CM | POA: Diagnosis not present

## 2022-01-21 DIAGNOSIS — E785 Hyperlipidemia, unspecified: Secondary | ICD-10-CM

## 2022-01-21 NOTE — Patient Instructions (Addendum)
Vaccines I recommend:  Shingrix (shingles) COVID booster  Check the  blood pressure regularly BP GOAL is between 110/65 and  135/85. If it is consistently higher or lower, let me know     GO TO THE LAB : Get the blood work     Justin Elliott, [place] back for   a physical exam in 1 year     Do you have a "Living will" or "Odin of attorney"? (Advance care planning documents)  If you already have a living will or healthcare power of attorney, is recommended you bring the copy to be scanned in your chart. The document will be available to all the doctors you see in the system.  More information at:  [email or web address]

## 2022-01-21 NOTE — Progress Notes (Unsigned)
   Subjective:    Patient ID: Justin Elliott, male    DOB: 1965/04/18, 56 y.o.   MRN: [id number]  DOS:  01/21/2022 Type of visit - description: CPX  Since the last office visit is doing well. Denies any symptoms, specifically no LUTS  Review of Systems See above   Past Medical History:  Diagnosis Date   Elevated serum creatinine    Family history of breast cancer    Family history of prostate cancer    Gout    HTN (hypertension) 2009   Polypoid middle turbinate    Prostate cancer [facility])     Past Surgical History:  Procedure Laterality Date   FINGER SURGERY Right 1984   LYMPHADENECTOMY Bilateral 08/17/2020   Procedure: LYMPHADENECTOMY, PELVIC;  Surgeon: Raynelle Bring, MD;  Location: [facility];  Service: Urology;  Laterality: Bilateral;   NASAL ENDOSCOPY     PROSTATE BIOPSY  05/20/2020   +   ROBOT ASSISTED LAPAROSCOPIC RADICAL PROSTATECTOMY N/A 08/17/2020   Procedure: XI ROBOTIC ASSISTED LAPAROSCOPIC RADICAL PROSTATECTOMY LEVEL 2;  Surgeon: Raynelle Bring, MD;  Location: [facility];  Service: Urology;  Laterality: N/A;    Current Outpatient Medications  Medication Instructions   amLODipine (NORVASC) 10 mg, Oral, Daily   carvedilol (COREG) 25 mg, Oral, 2 times daily with meals   colchicine 0.6 mg, Oral, 2 times daily PRN   desonide (DESOWEN) 0.05 % lotion Topical, As needed   predniSONE (DELTASONE) 10 MG tablet 3 tabs x 3 days, 2 tabs x 3 days, 1 tab x 3 days       Objective:   Physical Exam BP 126/74   Pulse 76   Temp 98.4 F (36.9 C) (Oral)   Resp 18   Ht 5' 11.5" (1.816 m)   Wt 249 lb 4 oz (113.1 kg)   SpO2 97%   BMI 34.28 kg/m  General: Well developed, NAD, BMI noted Neck: No  thyromegaly  HEENT:  Normocephalic . Face symmetric, atraumatic Lungs:  CTA B Normal respiratory effort, no intercostal retractions, no accessory muscle use. Heart: RRR,  no murmur.  Abdomen:  Not distended, soft, non-tender. No rebound or rigidity.   Lower extremities: no pretibial  edema bilaterally  Skin: Exposed areas without rash. Not pale. Not jaundice Neurologic:  alert & oriented X3.  Speech normal, gait appropriate for age and unassisted Strength symmetric and appropriate for age.  Psych: Cognition and judgment appear intact.  Cooperative with normal attention span and concentration.  Behavior appropriate. No anxious or depressed appearing.     Assessment     Assessment: HTN Gout: DX 08-2016 Slightly increased creatinine:  Renal US 2017 wnl, microalbumin (-). Likely d/t large muscle mass. Saw nephrology 11/2016, they rec good BP control, treat elevated cholesterol if needed, weight loss, avoid NSAIDs, keep hydrated, consider treatment w/ uric acid lowering meds Prostate cancer: Radical prostatectomy 08/17/2020, PSA remains slightly elevated, had hormone deprivation therapy and XRT.  As of 01/2022: Asymptomatic, PSA 0.  PLAN Here for CPX Prostate cancer: After radical prostatectomy, PSA remains slightly elevated, he had a salvage XRT, last visit with urology 12-2021, PSA 12/17/2021: 0.  Doing well. RTC 1 year    - Td: Today - shingrix vax: Recommended -Covid vac x3 rec booster  -Flu shot today -- + FH for  prostate, breast cancer, TIA stroke. --CCS: cscope 08-2015, 10 years - Prostate cancer: See comments above -Labs: CMP, FLP, CBC, -ACP: Info provided.

## 2022-01-22 ENCOUNTER — Encounter: Payer: Self-pay | Admitting: Internal Medicine

## 2022-01-22 LAB — COMPREHENSIVE METABOLIC PANEL
AG Ratio: 1.4 (calc) (ref 1.0–2.5)
ALT: 26 U/L (ref 9–46)
AST: 21 U/L (ref 10–35)
Albumin: 4.3 g/dL (ref 3.6–5.1)
Alkaline phosphatase (APISO): 72 U/L (ref 35–144)
BUN/Creatinine Ratio: 8 (calc) (ref 6–22)
BUN: 13 mg/dL (ref 7–25)
CO2: 28 mmol/L (ref 20–32)
Calcium: 9.6 mg/dL (ref 8.6–10.3)
Chloride: 104 mmol/L (ref 98–110)
Creat: 1.6 mg/dL — ABNORMAL HIGH (ref 0.70–1.30)
Globulin: 3.1 g/dL (calc) (ref 1.9–3.7)
Glucose, Bld: 93 mg/dL (ref 65–99)
Potassium: 3.8 mmol/L (ref 3.5–5.3)
Sodium: 141 mmol/L (ref 135–146)
Total Bilirubin: 0.5 mg/dL (ref 0.2–1.2)
Total Protein: 7.4 g/dL (ref 6.1–8.1)

## 2022-01-22 LAB — CBC WITH DIFFERENTIAL/PLATELET
Absolute Monocytes: 342 cells/uL (ref 200–950)
Basophils Absolute: 22 cells/uL (ref 0–200)
Basophils Relative: 0.6 %
Eosinophils Absolute: 79 cells/uL (ref 15–500)
Eosinophils Relative: 2.2 %
HCT: 36.5 % — ABNORMAL LOW (ref 38.5–50.0)
Hemoglobin: 12.6 g/dL — ABNORMAL LOW (ref 13.2–17.1)
Lymphs Abs: 1080 cells/uL (ref 850–3900)
MCH: 28.1 pg (ref 27.0–33.0)
MCHC: 34.5 g/dL (ref 32.0–36.0)
MCV: 81.5 fL (ref 80.0–100.0)
MPV: 11.2 fL (ref 7.5–12.5)
Monocytes Relative: 9.5 %
Neutro Abs: 2077 cells/uL (ref 1500–7800)
Neutrophils Relative %: 57.7 %
Platelets: 244 10*3/uL (ref 140–400)
RBC: 4.48 10*6/uL (ref 4.20–5.80)
RDW: 14 % (ref 11.0–15.0)
Total Lymphocyte: 30 %
WBC: 3.6 10*3/uL — ABNORMAL LOW (ref 3.8–10.8)

## 2022-01-22 LAB — LIPID PANEL
Cholesterol: 210 mg/dL — ABNORMAL HIGH (ref <200)
HDL: 31 mg/dL — ABNORMAL LOW (ref >=40)
LDL Cholesterol (Calc): 140 mg/dL (calc) — ABNORMAL HIGH
Non-HDL Cholesterol (Calc): 179 mg/dL (calc) — ABNORMAL HIGH (ref <130)
Total CHOL/HDL Ratio: 6.8 (calc) — ABNORMAL HIGH (ref <5.0)
Triglycerides: 243 mg/dL — ABNORMAL HIGH (ref <150)

## 2022-01-22 NOTE — Assessment & Plan Note (Signed)
-   Td: Today - shingrix vax: Recommended -Covid vac x3 rec booster  -Flu shot today -- + FH for  prostate, breast cancer, TIA stroke. --CCS: cscope 08-2015, 10 years - Prostate cancer: See comments above -Labs: CMP, FLP, CBC, -ACP: Info provided.

## 2022-01-22 NOTE — Assessment & Plan Note (Signed)
Here for CPX Prostate cancer: After radical prostatectomy, PSA remained slightly elevated, he had a salvage XRT, last visit with urology 12-2021, PSA 12/17/2021: 0.  Doing well. RTC 1 year

## 2022-01-24 NOTE — Addendum Note (Signed)
Addended byDamita Dunnings D on: 01/24/2022 04:38 PM   Modules accepted: Orders

## 2022-01-25 ENCOUNTER — Encounter: Payer: Self-pay | Admitting: Internal Medicine

## 2022-01-25 DIAGNOSIS — E785 Hyperlipidemia, unspecified: Secondary | ICD-10-CM

## 2022-01-26 MED ORDER — ATORVASTATIN CALCIUM 20 MG PO TABS: 20.0000 mg | ORAL_TABLET | Freq: Every day | ORAL | 0 refills | Status: DC

## 2022-01-26 NOTE — Addendum Note (Signed)
Addended by: Damita Dunnings D on: 01/26/2022 11:04 AM   Modules accepted: Orders

## 2022-01-26 NOTE — Telephone Encounter (Signed)
Rx sent. AST, ALT, FLP ordered.

## 2022-01-26 NOTE — Telephone Encounter (Signed)
Send atorvastatin 20 mg 1 p.o. nightly, 59-monthsupply.   The 10-year ASCVD risk score (Arnett DK, et al., 2019) is: 11.7%

## 2022-03-01 ENCOUNTER — Encounter: Payer: Self-pay | Admitting: Internal Medicine

## 2022-03-01 MED ORDER — AMLODIPINE BESYLATE 10 MG PO TABS: 10.0000 mg | ORAL_TABLET | Freq: Every day | ORAL | 0 refills | Status: DC

## 2022-03-01 MED ORDER — CARVEDILOL 25 MG PO TABS: 25.0000 mg | ORAL_TABLET | Freq: Two times a day (BID) | ORAL | 0 refills | Status: DC

## 2022-04-01 ENCOUNTER — Other Ambulatory Visit: Payer: Self-pay | Admitting: Family

## 2022-04-26 ENCOUNTER — Other Ambulatory Visit: Payer: Self-pay | Admitting: Internal Medicine

## 2022-07-29 LAB — PSA: PSA: 0.41

## 2022-08-24 ENCOUNTER — Encounter: Payer: Self-pay | Admitting: Internal Medicine

## 2022-09-17 ENCOUNTER — Other Ambulatory Visit: Payer: Self-pay | Admitting: Internal Medicine

## 2022-11-07 LAB — PSA: PSA: 0.89

## 2022-11-14 ENCOUNTER — Encounter: Payer: Self-pay | Admitting: Internal Medicine

## 2023-01-23 LAB — PSA: PSA: 1.12

## 2023-02-06 ENCOUNTER — Encounter: Payer: Self-pay | Admitting: Internal Medicine

## 2023-03-17 ENCOUNTER — Encounter: Payer: Self-pay | Admitting: Internal Medicine

## 2023-03-17 ENCOUNTER — Ambulatory Visit (INDEPENDENT_AMBULATORY_CARE_PROVIDER_SITE_OTHER): Payer: 59 | Admitting: Internal Medicine

## 2023-03-17 VITALS — BP 132/80 | HR 79 | Temp 98.0°F | Resp 16 | Ht 71.5 in | Wt 243.4 lb

## 2023-03-17 DIAGNOSIS — I10 Essential (primary) hypertension: Secondary | ICD-10-CM | POA: Diagnosis not present

## 2023-03-17 DIAGNOSIS — E785 Hyperlipidemia, unspecified: Secondary | ICD-10-CM

## 2023-03-17 DIAGNOSIS — M109 Gout, unspecified: Secondary | ICD-10-CM | POA: Diagnosis not present

## 2023-03-17 DIAGNOSIS — Z0000 Encounter for general adult medical examination without abnormal findings: Secondary | ICD-10-CM | POA: Diagnosis not present

## 2023-03-17 NOTE — Patient Instructions (Addendum)
 Is important you see your urologist regularly.  Vaccines I recommend: Flu shot Shingrix (shingles) COVID-vaccine.   Check the  blood pressure regularly Blood pressure goal:  between 110/65 and  135/85. If it is consistently higher or lower, let me know     GO TO THE LAB : Get the blood work     Next visit with me in 1 year    Please schedule it at the front desk        Health Care Power of attorney ,  Living will (Advance care planning documents)  If you already have a living will or healthcare power of attorney, is recommended you bring the copy to be scanned in your chart.   The document will be available to all the doctors you see in the system.  Advance care planning is a process that supports adults in  understanding and sharing their preferences regarding future medical care.  The patient's preferences are recorded in documents called Advance Directives and the can be modified at any time while the patient is in full mental capacity.   If you don't have one, please consider create one.      More information at: [email or web address]

## 2023-03-17 NOTE — Progress Notes (Signed)
 Subjective:    Patient ID: Justin Elliott, male    DOB: 1965/11/23, 58 y.o.   MRN: [id number]  DOS:  03/17/2023 Type of visit - description: cpx Here for CPX. Doing well. Specifically denies chest pain or difficulty breathing. No dysuria or gross hematuria. No fever or chills.   Review of Systems  Other than above, a 14 point review of systems is negative      Past Medical History:  Diagnosis Date   Elevated serum creatinine    Family history of breast cancer    Family history of prostate cancer    Gout    HTN (hypertension) 2009   Polypoid middle turbinate    Prostate cancer [facility])     Past Surgical History:  Procedure Laterality Date   FINGER SURGERY Right 1984   LYMPHADENECTOMY Bilateral 08/17/2020   Procedure: LYMPHADENECTOMY, PELVIC;  Surgeon: Renda Glance, MD;  Location: [facility];  Service: Urology;  Laterality: Bilateral;   NASAL ENDOSCOPY     PROSTATE BIOPSY  05/20/2020   +   ROBOT ASSISTED LAPAROSCOPIC RADICAL PROSTATECTOMY N/A 08/17/2020   Procedure: XI ROBOTIC ASSISTED LAPAROSCOPIC RADICAL PROSTATECTOMY LEVEL 2;  Surgeon: Renda Glance, MD;  Location: [facility];  Service: Urology;  Laterality: N/A;   Social History   Socioeconomic History   Marital status: Married    Spouse name: Shivaan Tierno   Number of children: 2   Years of education: Not on file   Highest education level: Not on file  Occupational History   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  Tobacco Use   Smoking status: Never   Smokeless tobacco: Never  Vaping Use   Vaping status: Never Used  Substance and Sexual Activity   Alcohol use: No    Alcohol/week: 0.0 standard drinks of alcohol   Drug use: No   Sexual activity: Yes  Other Topics Concern   Not on file  Social History Narrative   Household: wife      D 2003    S 1998 (graduated , college)   PHD 2017   2 children: [employer] and [employer]   Social Drivers of Corporate Investment Banker Strain: Not on file  Food  Insecurity: Not on file  Transportation Needs: Not on file  Physical Activity: Not on file  Stress: Not on file  Social Connections: Not on file  Intimate Partner Violence: Not on file    Current Outpatient Medications  Medication Instructions   amLODipine  (NORVASC ) 10 mg, Oral, Daily   atorvastatin  (LIPITOR) 20 mg, Oral, Daily at bedtime   carvedilol  (COREG ) 25 mg, Oral, 2 times daily with meals   colchicine  0.6 mg, Oral, 2 times daily PRN   desonide  (DESOWEN ) 0.05 % lotion Topical, As needed       Objective:   Physical Exam BP 132/80   Pulse 79   Temp 98 F (36.7 C) (Oral)   Resp 16   Ht 5' 11.5 (1.816 m)   Wt 243 lb 6 oz (110.4 kg)   SpO2 97%   BMI 33.47 kg/m  General: Well developed, NAD, BMI noted Neck: No  thyromegaly  HEENT:  Normocephalic . Face symmetric, atraumatic Lungs:  CTA B Normal respiratory effort, no intercostal retractions, no accessory muscle use. Heart: RRR,  no murmur.  Abdomen:  Not distended, soft, non-tender. No rebound or rigidity.   Lower extremities: no pretibial edema bilaterally  Skin: Exposed areas without rash. Not pale. Not jaundice Neurologic:  alert & oriented X3.  Speech  normal, gait appropriate for age and unassisted Strength symmetric and appropriate for age.  Psych: Cognition and judgment appear intact.  Cooperative with normal attention span and concentration.  Behavior appropriate. No anxious or depressed appearing.     Assessment   Assessment: HTN Gout: DX 08-2016 Slightly increased creatinine:  Renal US  2017 wnl, microalbumin (-). Likely d/t large muscle mass. Saw nephrology 11/2016, they rec good BP control, treat elevated cholesterol if needed, weight loss, avoid NSAIDs, keep hydrated, consider treatment w/ uric acid lowering meds Prostate cancer: Radical prostatectomy 08/17/2020, PSA remained slightly elevated, had hormone deprivation therapy and XRT.  As of 01/2022: Asymptomatic, PSA 0. (+) FH  breast cancer,  TIA stroke.  PLAN Here for CPX - Td: 2023 -  Rec Flu shot, covid vax and consider shingrix --CCS: cscope 08-2015, 10 years - Prostate cancer: See comments under prostate cancer -Labs:CMP FLP CBC A1c TSH -ACP: Info provided. We also addressed other issues: HTN:On amlodipine , carvedilol , recommend monitoring BPs, labs. High cholesterol: On atorvastatin , checking labs. Gout: No recent event, take colchicine  as needed, last uric acid elevated, recheck. Prostate cancer, h/o Saw urology  11/11/2022---  had biochemically recurrent prostate cancer.  They recommended a 25-month follow-up.  Encouraged patient to see them regularly. RTC 1 year.  Sooner if needed.

## 2023-03-18 ENCOUNTER — Encounter: Payer: Self-pay | Admitting: Internal Medicine

## 2023-03-18 LAB — CBC WITH DIFFERENTIAL/PLATELET
Absolute Lymphocytes: 1365 {cells}/uL (ref 850–3900)
Absolute Monocytes: 400 {cells}/uL (ref 200–950)
Basophils Absolute: 19 {cells}/uL (ref 0–200)
Basophils Relative: 0.5 %
Eosinophils Absolute: 48 {cells}/uL (ref 15–500)
Eosinophils Relative: 1.3 %
HCT: 38.2 % — ABNORMAL LOW (ref 38.5–50.0)
Hemoglobin: 13.2 g/dL (ref 13.2–17.1)
MCH: 28.6 pg (ref 27.0–33.0)
MCHC: 34.6 g/dL (ref 32.0–36.0)
MCV: 82.9 fL (ref 80.0–100.0)
MPV: 11 fL (ref 7.5–12.5)
Monocytes Relative: 10.8 %
Neutro Abs: 1869 {cells}/uL (ref 1500–7800)
Neutrophils Relative %: 50.5 %
Platelets: 231 10*3/uL (ref 140–400)
RBC: 4.61 10*6/uL (ref 4.20–5.80)
RDW: 13.5 % (ref 11.0–15.0)
Total Lymphocyte: 36.9 %
WBC: 3.7 10*3/uL — ABNORMAL LOW (ref 3.8–10.8)

## 2023-03-18 LAB — COMPREHENSIVE METABOLIC PANEL
AG Ratio: 1.5 (calc) (ref 1.0–2.5)
ALT: 17 U/L (ref 9–46)
AST: 15 U/L (ref 10–35)
Albumin: 4.3 g/dL (ref 3.6–5.1)
Alkaline phosphatase (APISO): 71 U/L (ref 35–144)
BUN/Creatinine Ratio: 8 (calc) (ref 6–22)
BUN: 12 mg/dL (ref 7–25)
CO2: 24 mmol/L (ref 20–32)
Calcium: 9.6 mg/dL (ref 8.6–10.3)
Chloride: 105 mmol/L (ref 98–110)
Creat: 1.52 mg/dL — ABNORMAL HIGH (ref 0.70–1.30)
Globulin: 2.9 g/dL (ref 1.9–3.7)
Glucose, Bld: 87 mg/dL (ref 65–99)
Potassium: 3.9 mmol/L (ref 3.5–5.3)
Sodium: 139 mmol/L (ref 135–146)
Total Bilirubin: 0.4 mg/dL (ref 0.2–1.2)
Total Protein: 7.2 g/dL (ref 6.1–8.1)

## 2023-03-18 LAB — LIPID PANEL
Cholesterol: 191 mg/dL (ref <200)
HDL: 29 mg/dL — ABNORMAL LOW (ref >=40)
LDL Cholesterol (Calc): 126 mg/dL — ABNORMAL HIGH
Non-HDL Cholesterol (Calc): 162 mg/dL — ABNORMAL HIGH (ref <130)
Total CHOL/HDL Ratio: 6.6 (calc) — ABNORMAL HIGH (ref <5.0)
Triglycerides: 223 mg/dL — ABNORMAL HIGH (ref <150)

## 2023-03-18 LAB — HEMOGLOBIN A1C
Hgb A1c MFr Bld: 5.4 %{Hb} (ref <5.7)
Mean Plasma Glucose: 108 mg/dL
eAG (mmol/L): 6 mmol/L

## 2023-03-18 LAB — TSH: TSH: 1.62 m[IU]/L (ref 0.40–4.50)

## 2023-03-18 LAB — URIC ACID: Uric Acid, Serum: 10.3 mg/dL — ABNORMAL HIGH (ref 4.0–8.0)

## 2023-03-18 NOTE — Assessment & Plan Note (Signed)
 Here for CPX - Td: 2023 -  Rec Flu shot, covid vax and consider shingrix --CCS: cscope 08-2015, 10 years - Prostate cancer: See comments under prostate cancer -Labs:CMP FLP CBC A1c TSH -ACP: Info provided.

## 2023-03-18 NOTE — Assessment & Plan Note (Signed)
 Here for CPX We also addressed other issues: HTN:On amlodipine , carvedilol , recommend monitoring BPs, labs. High cholesterol: On atorvastatin , checking labs. Gout: No recent event, take colchicine  as needed, last uric acid elevated, recheck. Prostate cancer, h/o Saw urology  11/11/2022---  had biochemically recurrent prostate cancer.  They recommended a 71-month follow-up.  Encouraged patient to see them regularly. RTC 1 year.  Sooner if needed.

## 2023-03-19 ENCOUNTER — Encounter: Payer: Self-pay | Admitting: Internal Medicine

## 2023-03-20 MED ORDER — ATORVASTATIN CALCIUM 40 MG PO TABS: 40.0000 mg | ORAL_TABLET | Freq: Every day | ORAL | 0 refills | Status: AC

## 2023-03-20 NOTE — Addendum Note (Signed)
 Addended by: Tayva Easterday D on: 03/20/2023 07:45 AM   Modules accepted: Orders

## 2023-04-24 LAB — PSA: PSA: 1.61

## 2023-05-05 ENCOUNTER — Encounter: Payer: Self-pay | Admitting: Internal Medicine

## 2023-07-02 ENCOUNTER — Other Ambulatory Visit: Payer: Self-pay | Admitting: Internal Medicine

## 2023-07-24 LAB — PSA: PSA: 2.52

## 2023-08-08 ENCOUNTER — Encounter: Payer: Self-pay | Admitting: Internal Medicine

## 2023-08-15 ENCOUNTER — Other Ambulatory Visit (HOSPITAL_COMMUNITY): Payer: Self-pay | Admitting: Urology

## 2023-08-15 DIAGNOSIS — C61 Malignant neoplasm of prostate: Secondary | ICD-10-CM

## 2023-08-28 ENCOUNTER — Encounter: Payer: Self-pay | Admitting: Internal Medicine

## 2023-08-28 ENCOUNTER — Encounter (HOSPITAL_COMMUNITY)
Admission: RE | Admit: 2023-08-28 | Discharge: 2023-08-28 | Disposition: A | Discharge status: Home / Self Care | Source: Ambulatory Visit | Attending: Urology | Admitting: Urology

## 2023-08-28 DIAGNOSIS — C61 Malignant neoplasm of prostate: Secondary | ICD-10-CM | POA: Diagnosis present

## 2023-08-28 MED ORDER — FLOTUFOLASTAT F 18 GALLIUM 296-5846 MBQ/ML IV SOLN
7.5500 | Freq: Once | INTRAVENOUS | Status: AC
  Administered 2023-08-28: 7.55 via INTRAVENOUS

## 2023-12-15 LAB — PSA: PSA: 4.3

## 2024-01-29 ENCOUNTER — Encounter: Payer: Self-pay | Admitting: Internal Medicine

## 2024-01-29 ENCOUNTER — Other Ambulatory Visit: Payer: Self-pay | Admitting: Internal Medicine

## 2024-03-22 ENCOUNTER — Encounter: Payer: 59 | Admitting: Internal Medicine
